# Patient Record
Sex: Female | Born: 1970 | Race: White | Hispanic: No | Marital: Single | Smoking: Never smoker
Health system: Southern US, Community
[De-identification: ages and names within clinical notes are randomized; demographics above are authoritative.]

## PROBLEM LIST (undated history)

## (undated) DIAGNOSIS — Z789 Other specified health status: Secondary | ICD-10-CM

## (undated) HISTORY — PX: CATARACT EXTRACTION: SUR2

---

## 2007-12-21 ENCOUNTER — Encounter (INDEPENDENT_AMBULATORY_CARE_PROVIDER_SITE_OTHER): Payer: Self-pay | Admitting: *Deleted

## 2007-12-27 ENCOUNTER — Encounter: Payer: Self-pay | Admitting: Family Medicine

## 2007-12-27 ENCOUNTER — Ambulatory Visit: Payer: Self-pay | Admitting: Family Medicine

## 2007-12-27 ENCOUNTER — Other Ambulatory Visit: Admission: RE | Admit: 2007-12-27 | Discharge: 2007-12-27 | Payer: Self-pay | Admitting: Family Medicine

## 2008-01-03 ENCOUNTER — Encounter (INDEPENDENT_AMBULATORY_CARE_PROVIDER_SITE_OTHER): Payer: Self-pay | Admitting: *Deleted

## 2010-11-24 ENCOUNTER — Other Ambulatory Visit: Payer: Self-pay | Admitting: Obstetrics and Gynecology

## 2017-10-07 ENCOUNTER — Other Ambulatory Visit: Payer: Self-pay | Admitting: Family Medicine

## 2017-10-07 DIAGNOSIS — R140 Abdominal distension (gaseous): Secondary | ICD-10-CM

## 2017-10-26 ENCOUNTER — Ambulatory Visit
Admission: RE | Admit: 2017-10-26 | Discharge: 2017-10-26 | Disposition: A | Discharge status: Home / Self Care | Payer: 59 | Source: Ambulatory Visit | Attending: Family Medicine | Admitting: Family Medicine

## 2017-10-26 DIAGNOSIS — R140 Abdominal distension (gaseous): Secondary | ICD-10-CM

## 2018-02-26 ENCOUNTER — Other Ambulatory Visit: Payer: Self-pay

## 2018-02-26 ENCOUNTER — Emergency Department (HOSPITAL_COMMUNITY)
Admission: EM | Admit: 2018-02-26 | Discharge: 2018-02-26 | Disposition: A | Discharge status: Home / Self Care | Payer: 59 | Attending: Emergency Medicine | Admitting: Emergency Medicine

## 2018-02-26 ENCOUNTER — Emergency Department (HOSPITAL_COMMUNITY): Payer: 59

## 2018-02-26 ENCOUNTER — Encounter (HOSPITAL_COMMUNITY): Payer: Self-pay | Admitting: Emergency Medicine

## 2018-02-26 DIAGNOSIS — Y999 Unspecified external cause status: Secondary | ICD-10-CM | POA: Diagnosis not present

## 2018-02-26 DIAGNOSIS — R52 Pain, unspecified: Secondary | ICD-10-CM

## 2018-02-26 DIAGNOSIS — Y92009 Unspecified place in unspecified non-institutional (private) residence as the place of occurrence of the external cause: Secondary | ICD-10-CM | POA: Diagnosis not present

## 2018-02-26 DIAGNOSIS — W010XXA Fall on same level from slipping, tripping and stumbling without subsequent striking against object, initial encounter: Secondary | ICD-10-CM | POA: Insufficient documentation

## 2018-02-26 DIAGNOSIS — S52571A Other intraarticular fracture of lower end of right radius, initial encounter for closed fracture: Secondary | ICD-10-CM | POA: Diagnosis not present

## 2018-02-26 DIAGNOSIS — Y9389 Activity, other specified: Secondary | ICD-10-CM | POA: Insufficient documentation

## 2018-02-26 DIAGNOSIS — S6991XA Unspecified injury of right wrist, hand and finger(s), initial encounter: Secondary | ICD-10-CM | POA: Diagnosis present

## 2018-02-26 DIAGNOSIS — S52611A Displaced fracture of right ulna styloid process, initial encounter for closed fracture: Secondary | ICD-10-CM

## 2018-02-26 IMAGING — DX DG WRIST 2V*R*
2 series · 2 of 2 positions shown · non-contrast
Comparison: Earlier same day

CLINICAL DATA: Post reduction distal radius fracture.

EXAM:
RIGHT WRIST - 2 VIEW

[wrist ap]
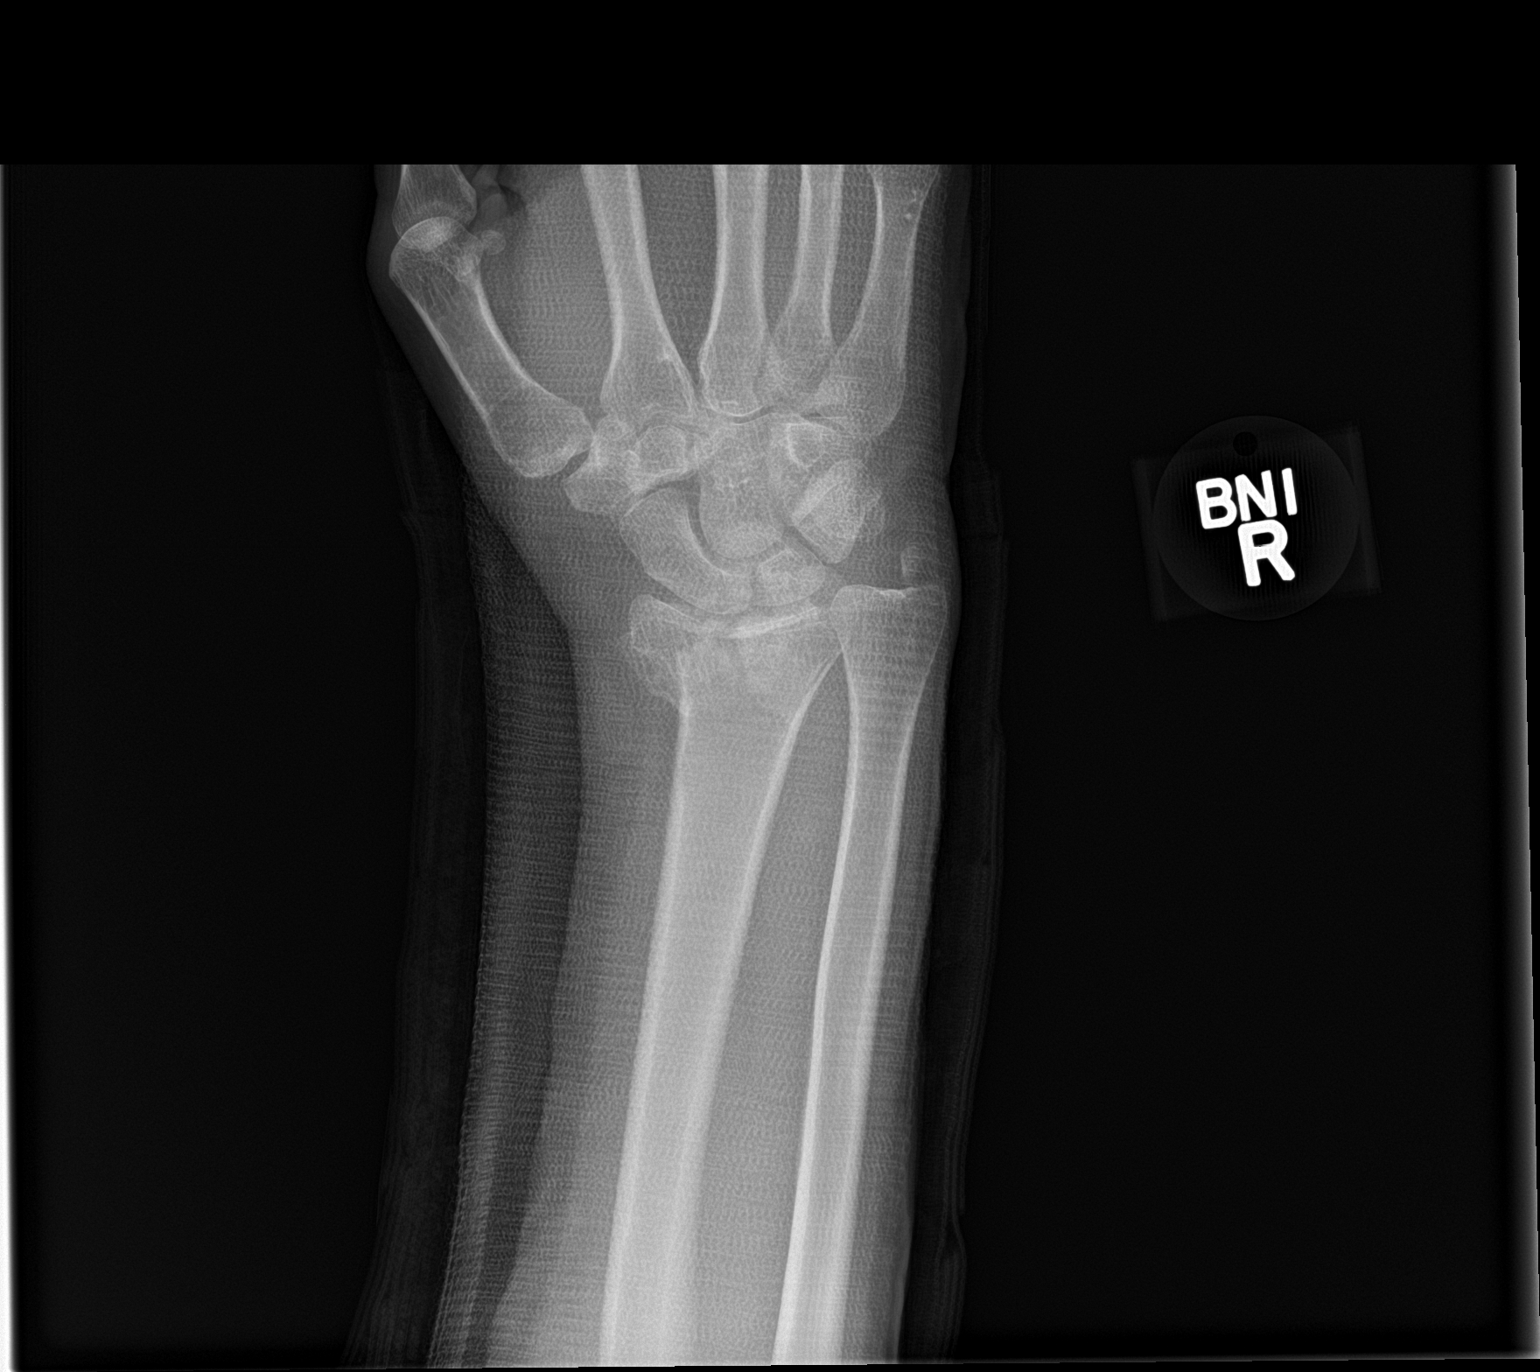

[wrist lat]
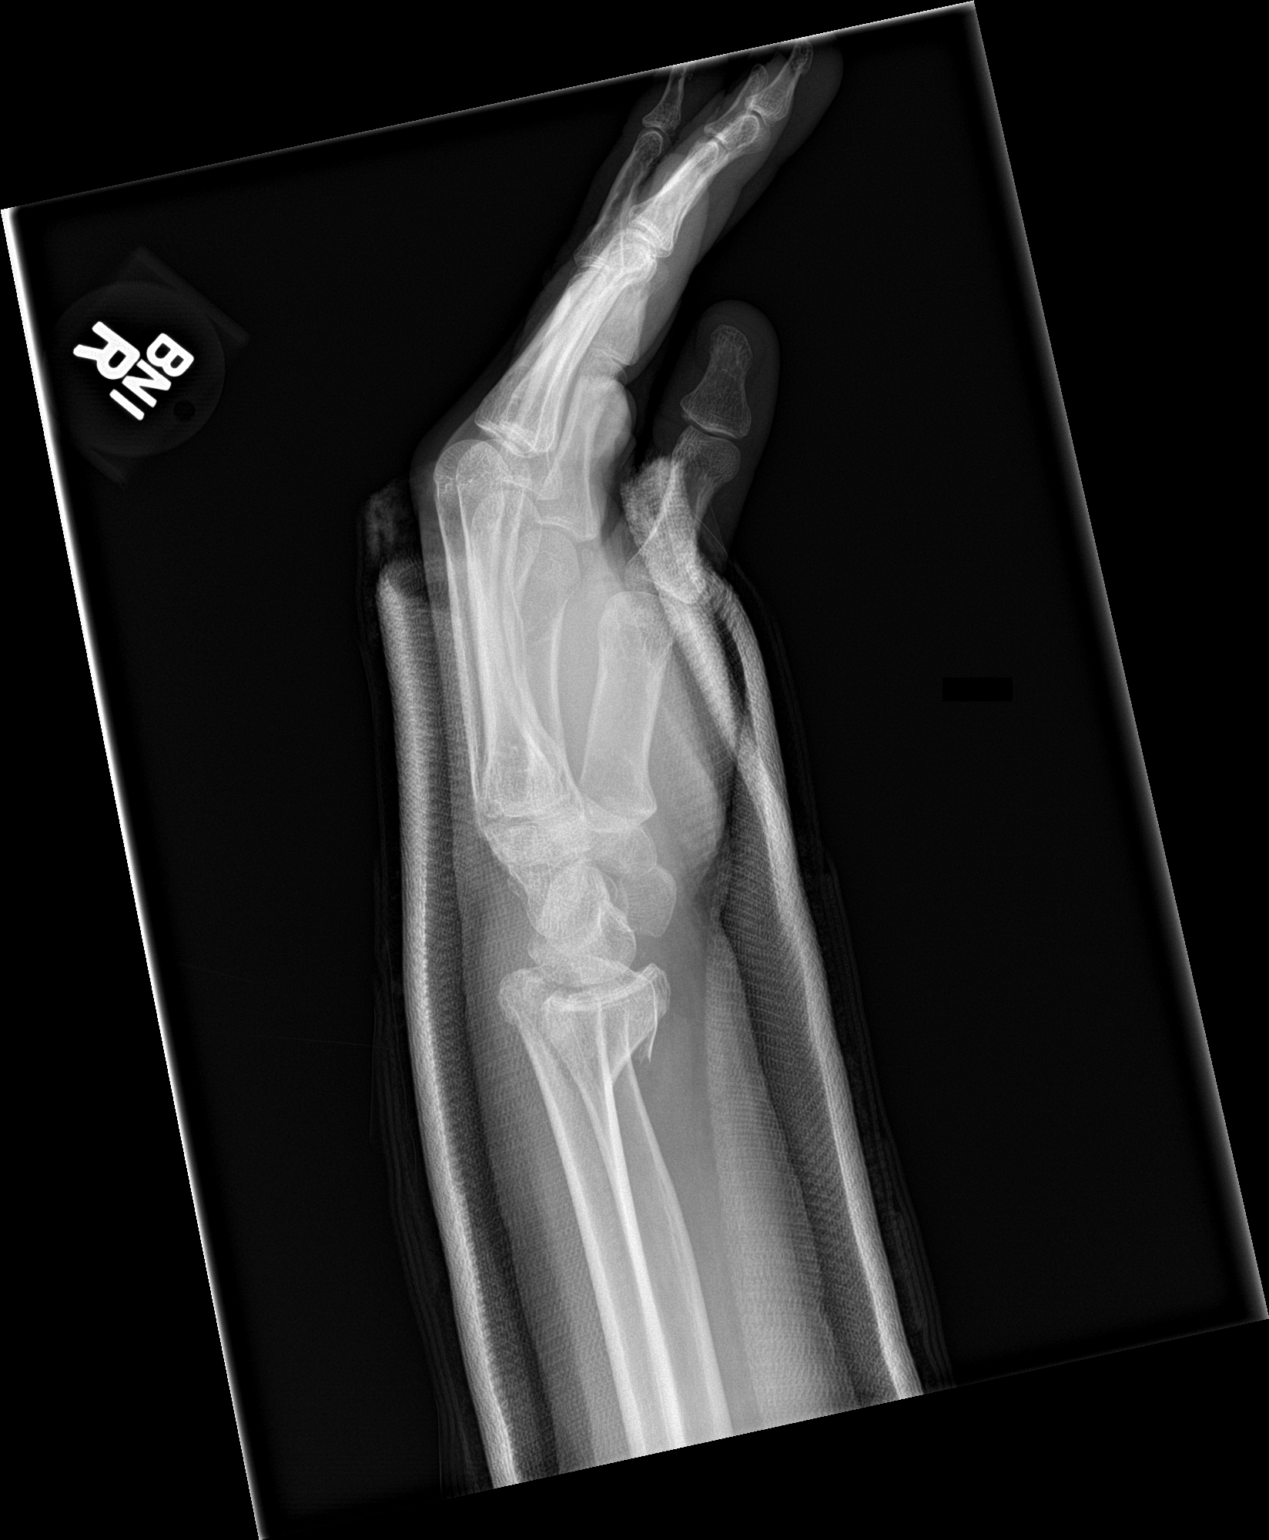

[2 of 2 positions shown; findings below may reference images not displayed]

FINDINGS: Two-view exam shows fine bony detail obscured by the overlying
fiberglass splint. Comminuted distal radius fracture again
identified with probable intra-articular extension. No substantial
change in bony alignment or position of the fracture fragments.
Associated ulnar styloid fracture evident.
IMPRESSION: Status post closed reduction with no substantial change in alignment
or position of fracture fragments.

## 2018-02-26 MED ORDER — ONDANSETRON HCL 4 MG/2ML IJ SOLN
4.0000 mg | Freq: Once | INTRAMUSCULAR | Status: AC
  Administered 2018-02-26: 4 mg via INTRAVENOUS
  Filled 2018-02-26: qty 2

## 2018-02-26 MED ORDER — HYDROMORPHONE HCL 1 MG/ML IJ SOLN
1.0000 mg | Freq: Once | INTRAMUSCULAR | Status: AC
  Administered 2018-02-26: 1 mg via INTRAVENOUS
  Filled 2018-02-26: qty 1

## 2018-02-26 MED ORDER — LIDOCAINE HCL (PF) 1 % IJ SOLN
30.0000 mL | Freq: Once | INTRAMUSCULAR | Status: AC
  Administered 2018-02-26: 30 mL
  Filled 2018-02-26: qty 30

## 2018-02-26 MED ORDER — FENTANYL CITRATE (PF) 100 MCG/2ML IJ SOLN
50.0000 ug | Freq: Once | INTRAMUSCULAR | Status: AC
  Administered 2018-02-26: 50 ug via INTRAVENOUS
  Filled 2018-02-26: qty 2

## 2018-02-26 MED ORDER — HYDROCODONE-ACETAMINOPHEN 5-325 MG PO TABS: 1.0000 | ORAL_TABLET | ORAL | 0 refills | Status: DC | PRN

## 2018-02-26 NOTE — Progress Notes (Signed)
Orthopedic Tech Progress Note Patient Details:  Isabella Morales Jun 15, 1970 [id number]  Ortho Devices Type of Ortho Device: Ace wrap, Sugartong splint, Arm sling Ortho Device/Splint Interventions: Application   Post Interventions Patient Tolerated: Well Instructions Provided: Care of device   Saul Fordyce 02/26/2018, 4:27 PM

## 2018-02-26 NOTE — ED Provider Notes (Signed)
.  Nerve Block Date/Time: 02/26/2018 4:10 PM Performed by: Sabas Sous, MD Authorized by: Sabas Sous, MD   Consent:    Consent obtained:  Verbal   Consent given by:  Patient   Risks discussed:  Unsuccessful block Indications:    Indications:  Procedural anesthesia Location:    Body area:  Upper extremity   Upper extremity nerve blocked: wrist.   Laterality:  Right Pre-procedure details:    Skin preparation:  2% chlorhexidine   Preparation: Patient was prepped and draped in usual sterile fashion   Procedure details (see MAR for exact dosages):    Block needle gauge:  27 G   Anesthetic injected:  Lidocaine 1% w/o epi   Injection procedure:  Anatomic landmarks identified and anatomic landmarks palpated   Paresthesia:  None Post-procedure details:    Dressing: bandaid.   Outcome:  Anesthesia achieved   Patient tolerance of procedure:  Tolerated well, no immediate complications Comments:     Comminuted distal radius fracture with displacement; uncomplicated and successful hematoma block.  Total of 10 cc of lidocaine. Reduction of fracture Date/Time: 02/26/2018 4:12 PM Performed by: Sabas Sous, MD Authorized by: Sabas Sous, MD  Consent: Verbal consent obtained. Risks and benefits: risks, benefits and alternatives were discussed Consent given by: patient Patient understanding: patient states understanding of the procedure being performed Patient identity confirmed: verbally with patient Local anesthesia used: yes Anesthesia: hematoma block  Anesthesia: Local anesthesia used: yes Local Anesthetic: lidocaine 1% without epinephrine Anesthetic total: 10 mL  Sedation: Patient sedated: no  Patient tolerance: Patient tolerated the procedure well with no immediate complications Comments: Good anesthesia with hematoma block.  Reduction of comminuted and mildly displaced right distal radius fracture using traction and finger traps.       Sabas Sous,  MD 02/26/18 [phone]

## 2018-02-26 NOTE — ED Notes (Signed)
EDP at bedside  

## 2018-02-26 NOTE — ED Triage Notes (Signed)
Pt. Stated, I fell playing with my niece and fell and caught myself on my rt. Wrist.

## 2018-02-26 NOTE — ED Provider Notes (Signed)
[facility] EMERGENCY DEPARTMENT Provider Note   CSN: [id number] Arrival date & time: 02/26/18  1336     History   Chief Complaint Chief Complaint  Patient presents with  . Wrist Pain  . Fall    HPI Isabella Morales is a 48 y.o. female.  48 year old female presents with right wrist injury.  Patient states she was playing with her niece when she fell over towards the right side landing on her right wrist.  Pain in the right wrist with deformity. Patient is right-hand dominant no other injuries or concerns. Last ate a few chips about 2 hours PTA, advised NPO at this time.      History reviewed. No pertinent past medical history.  There are no active problems to display for this patient.   History reviewed. No pertinent surgical history.   OB History   No obstetric history on file.      Home Medications    Prior to Admission medications   Medication Sig Start Date End Date Taking? Authorizing Provider  HYDROcodone-acetaminophen (NORCO/VICODIN) 5-325 MG tablet Take 1 tablet by mouth every 4 (four) hours as needed. 02/26/18   Jeannie Fend, PA-C    Family History No family history on file.  Social History Social History   Tobacco Use  . Smoking status: Never Smoker  . Smokeless tobacco: Never Used  Substance Use Topics  . Alcohol use: Yes  . Drug use: Not Currently     Allergies   Patient has no allergy information on record.   Review of Systems Review of Systems  Constitutional: Negative for fever.  Musculoskeletal: Positive for arthralgias, joint swelling and myalgias.  Skin: Negative for rash and wound.  Allergic/Immunologic: Negative for immunocompromised state.  Neurological: Negative for weakness and numbness.  Psychiatric/Behavioral: Negative for confusion.  All other systems reviewed and are negative.    Physical Exam Updated Vital Signs BP (!) 107/55 (BP Location: Left Arm)   Pulse 77   Resp 16   Ht 5\' 8"  (1.727 m)    Wt 68 kg   LMP 02/12/2018   SpO2 100%   BMI 22.81 kg/m   Physical Exam Vitals signs and nursing note reviewed.  Constitutional:      General: She is not in acute distress.    Appearance: She is well-developed. She is not diaphoretic.  HENT:     Head: Normocephalic and atraumatic.  Cardiovascular:     Pulses: Normal pulses.  Pulmonary:     Effort: Pulmonary effort is normal.  Musculoskeletal:        General: Tenderness, deformity and signs of injury present.     Right shoulder: She exhibits no tenderness and no bony tenderness.     Right elbow: She exhibits no swelling, no effusion and no deformity. No tenderness found.     Right wrist: She exhibits bony tenderness and deformity. She exhibits no laceration.     Right hand: She exhibits no tenderness, no bony tenderness and normal capillary refill. Normal sensation noted.  Skin:    General: Skin is warm and dry.     Findings: No erythema or rash.  Neurological:     General: No focal deficit present.     Mental Status: She is alert and oriented to person, place, and time.  Psychiatric:        Behavior: Behavior normal.      ED Treatments / Results  Labs (all labs ordered are listed, but only abnormal results are displayed)  Labs Reviewed - No data to display  EKG None  Radiology Dg Wrist Complete Right  Result Date: 02/26/2018 CLINICAL DATA:  Fall in driveway. Right wrist pain and deformity. Initial encounter. EXAM: RIGHT WRIST - COMPLETE 3+ VIEW COMPARISON:  None. FINDINGS: Comminuted and mildly displaced fracture of distal radial metaphysis is seen with intra-articular extension into the radiocarpal joint. Fracture through the ulnar styloid process is also seen. No evidence of dislocation. IMPRESSION: Distal radial metaphysis and ulnar styloid fractures, as described above. Electronically Signed   By: Myles Rosenthal M.D.   On: 02/26/2018 15:08    Procedures Procedures (including critical care time)  Medications  Ordered in ED Medications  fentaNYL (SUBLIMAZE) injection 50 mcg (50 mcg Intravenous Given 02/26/18 1351)  ondansetron (ZOFRAN) injection 4 mg (4 mg Intravenous Given 02/26/18 1406)  lidocaine (PF) (XYLOCAINE) 1 % injection 30 mL (30 mLs Other Given 02/26/18 1456)  HYDROmorphone (DILAUDID) injection 1 mg (1 mg Intravenous Given 02/26/18 1456)     Initial Impression / Assessment and Plan / ED Course  I have reviewed the triage vital signs and the nursing notes.  Pertinent labs & imaging results that were available during my care of the patient were reviewed by me and considered in my medical decision making (see chart for details).  Clinical Course as of Feb 27 1603  Sun Feb 26, 2018  505 49 year old female with right wrist injury after fall on outstretched hand.  Injury occurred today, patient is right-hand dominant, no other injuries.  On exam patient has deformity of right wrist with radial deviation of her hand, neurovascular intact, no pain at the elbow.  X-rays obtained and case discussed with Dr. Wandra Feinstein on-call with hand Ortho who has reviewed images and recommends hematoma block with traction x5 minutes, placed in sugar tong splint, obtain postreduction x-ray.  Patient to follow-up in the office tomorrow morning at 830 with plan for surgery on Thursday.  Discussed plan of care with patient who verbalizes understanding. Dr. Pilar Plate, ER attending, to see patient and assist with procedure.    [LM]  1603 Patient tolerated traction/splinting very well, plan is to obtain post reduction film prior to dc. Patient is aware of follow up plan including apt with Dr. Eulah Pont at 8:30AM tomorrow with plan for surgery on Thursday.    [LM]    Clinical Course User Index [LM] Jeannie Fend, PA-C   Final Clinical Impressions(s) / ED Diagnoses   Final diagnoses:  Other closed intra-articular fracture of distal end of right radius, initial encounter  Closed displaced fracture of styloid process of right  ulna, initial encounter    ED Discharge Orders         Ordered    HYDROcodone-acetaminophen (NORCO/VICODIN) 5-325 MG tablet  Every 4 hours PRN     02/26/18 1538           Jeannie Fend, PA-C 02/26/18 1606    Sabas Sous, MD 02/27/18 1044

## 2018-02-26 NOTE — ED Notes (Signed)
Patient transported to X-ray 

## 2018-02-26 NOTE — Discharge Instructions (Signed)
Follow-up with orthopedics, you have an appointment tomorrow morning at 830 with Dr. Eulah Pont. Return to the ER for any new or worsening symptoms. At home, elevate and apply ice on top of your splint for 20 minutes at a time. Take Norco as needed as prescribed for pain.

## 2018-02-26 NOTE — ED Notes (Signed)
Pt stable, ambulatory, states understanding of discharge instructions 

## 2018-09-13 ENCOUNTER — Encounter: Payer: Self-pay | Admitting: Family Medicine

## 2018-09-13 ENCOUNTER — Ambulatory Visit: Payer: 59 | Admitting: Family Medicine

## 2018-09-13 ENCOUNTER — Other Ambulatory Visit: Payer: Self-pay

## 2018-09-13 ENCOUNTER — Ambulatory Visit
Admission: RE | Admit: 2018-09-13 | Discharge: 2018-09-13 | Disposition: A | Discharge status: Home / Self Care | Payer: 59 | Source: Ambulatory Visit | Attending: Family Medicine | Admitting: Family Medicine

## 2018-09-13 ENCOUNTER — Ambulatory Visit: Payer: Self-pay

## 2018-09-13 VITALS — BP 128/74

## 2018-09-13 DIAGNOSIS — M25511 Pain in right shoulder: Secondary | ICD-10-CM

## 2018-09-13 DIAGNOSIS — S4992XA Unspecified injury of left shoulder and upper arm, initial encounter: Secondary | ICD-10-CM

## 2018-09-13 NOTE — Patient Instructions (Signed)
Your shoulder pain may be caused by a subscapularis (rotator cuff tear) The ultrasound from today did show irregularities along humerus You will get an xray of your shoulder to make sure there are no fractures of your humerus You will be called with the xray results. If normal, we will get an MRI of your shoulder to look for a rotator cuff tear

## 2018-09-13 NOTE — Progress Notes (Signed)
PCP: Vernie Shanks, MD  Subjective:   HPI: Patient is a 48 y.o. female here for evaluation of right shoulder pain.  Approximately 6 weeks ago on Father's Day weekend patient fell and injured her shoulder.  She is unsure of the exact mechanism of how she landed on her shoulder.  Patient now notes lateral and anterior shoulder pain.  She has had some improvement of her shoulder pain since the injury but still notes ongoing pain with overhead activities and she cannot bring her arm behind her back.  Initially following the injury patient did have swelling and bruising on the inner aspect of her arm.  This is since resolved and she has not had any recurrent bruising.  She denies any numbness or tingling in her arm.  She has no weakness extending down to her hand.  She denies any neck pain.  Patient is just taking over-the-counter inflammatories without improvement of her pain.  She has not had evaluation for her shoulder yet.  Review of Systems: See HPI above.  History reviewed. No pertinent past medical history.  Current Outpatient Medications on File Prior to Visit  Medication Sig Dispense Refill  . HYDROcodone-acetaminophen (NORCO/VICODIN) 5-325 MG tablet Take 1 tablet by mouth every 4 (four) hours as needed. 10 tablet 0   No current facility-administered medications on file prior to visit.     History reviewed. No pertinent surgical history.  Not on File  Social History   Socioeconomic History  . Marital status: Single    Spouse name: Not on file  . Number of children: Not on file  . Years of education: Not on file  . Highest education level: Not on file  Occupational History  . Not on file  Social Needs  . Financial resource strain: Not on file  . Food insecurity    Worry: Not on file    Inability: Not on file  . Transportation needs    Medical: Not on file    Non-medical: Not on file  Tobacco Use  . Smoking status: Never Smoker  . Smokeless tobacco: Never Used   Substance and Sexual Activity  . Alcohol use: Yes  . Drug use: Not Currently  . Sexual activity: Not on file  Lifestyle  . Physical activity    Days per week: Not on file    Minutes per session: Not on file  . Stress: Not on file  Relationships  . Social Herbalist on phone: Not on file    Gets together: Not on file    Attends religious service: Not on file    Active member of club or organization: Not on file    Attends meetings of clubs or organizations: Not on file    Relationship status: Not on file  . Intimate partner violence    Fear of current or ex partner: Not on file    Emotionally abused: Not on file    Physically abused: Not on file    Forced sexual activity: Not on file  Other Topics Concern  . Not on file  Social History Narrative  . Not on file    History reviewed. No pertinent family history.      Objective:  Physical Exam: BP 128/74  Gen: NAD, comfortable in exam room Lungs: Breathing comfortably on room air  Shoulder Exam Right -Inspection: No discoloration, no deformity -Palpation: Tenderness to palpation along the long head of the biceps tendon as well as the lateral aspect of the  shoulder at the supraspinatus insertion -ROM (active): Abduction: 150 degrees; Forward Flexion: 160 degrees; Internal Rotation: Unable to reach behind back -ROM (Passive): Abduction: 170 degrees; Forward Flexion: 180 degrees -Strength: Abduction: 4/5; Forward Flexion: 5/5; Internal Rotation: 4/5; External Rotation: 5/5 -Special Tests: Hawkins: Negative; Neers: Negative; Jobs: Negative; O'briens: Negative; Speeds: positive; belly press: Positive -Limb neurovascularly intact  Contralateral Shoulder -Inspection: No discoloration, no deformity -Palpation: No tenderness to palpation -ROM (active): Abduction: 180 degrees; Forward Flexion: 180 degrees; Internal Rotation: T10 -ROM (Passive): Abduction: 180 degrees; Forward Flexion: 180 degrees; Internal Rotation:  T10 -Strength: Abduction: 5/5; Forward Flexion: 5/5; Internal Rotation: 5/5; External Rotation: 5/5 -Limb neurovascularly intact  Cervical Exam:  -Full range of motion with flexion, extension, lateral rotation.    Ultrasound of right shoulder Findings: - Biceps tendon: Partial tearing was noted at the proximal insertion of the long head of the biceps.  This is seen in both long and short axis.  Further distal there is tenosynovitis seen along the long head of the biceps tendon - Subscapularis: There is cortical irregularity along the humeral head at the attachment point of the subscapularis.  The subscapularis is difficult to visualize with disruption of normal tendon fibers.  There are hypoechoic changes in this area. - Supraspinatus: Supraspinatus was intact with no evidence of tearing. - Infraspinatus: Infraspinatus had normal appearance - Teres minor: Teres minor had normal appearance -AC joint: AC joint with normal appearance - Glenohumeral joint: Glenohumeral joint with normal appearance Impression: - Ultrasound findings concerning for possible subscapularis tear as well as tear of the long head of the biceps tendon.  Cortical irregularity seen could be suggestive of a humeral fracture vs association with subscapularis tear   Assessment & Plan:  Patient is a 48 y.o. female here for evaluation of right shoulder pain.  1. Right shoulder injury - Exam and ultrasound findings concerning for possible subscapularis tear as well as cortical irregularity which may be consistent with a previous humeral fracture -We will obtain x-rays of the right shoulder to rule out fracture of the humerus -If x-rays of the right shoulder are normal and MRI will be obtained to further evaluate for subscapularis tear -We will call patient with results of the x-rays to discuss further work-up - Tylenol, ibuprofen if needed in meantime.

## 2018-09-14 NOTE — Addendum Note (Signed)
Addended by: Cyd Silence on: 09/14/2018 04:22 PM   Modules accepted: Orders

## 2018-10-10 ENCOUNTER — Other Ambulatory Visit: Payer: Self-pay

## 2018-10-10 ENCOUNTER — Ambulatory Visit
Admission: RE | Admit: 2018-10-10 | Discharge: 2018-10-10 | Disposition: A | Discharge status: Home / Self Care | Payer: 59 | Source: Ambulatory Visit | Attending: Family Medicine | Admitting: Family Medicine

## 2018-10-10 DIAGNOSIS — M25511 Pain in right shoulder: Secondary | ICD-10-CM

## 2018-10-11 ENCOUNTER — Encounter: Payer: Self-pay | Admitting: Family Medicine

## 2018-10-11 ENCOUNTER — Ambulatory Visit: Payer: Self-pay

## 2018-10-11 ENCOUNTER — Ambulatory Visit (INDEPENDENT_AMBULATORY_CARE_PROVIDER_SITE_OTHER): Payer: 59 | Admitting: Family Medicine

## 2018-10-11 VITALS — BP 124/64 | Wt 145.0 lb

## 2018-10-11 DIAGNOSIS — M25511 Pain in right shoulder: Secondary | ICD-10-CM

## 2018-10-11 DIAGNOSIS — S42262D Displaced fracture of lesser tuberosity of left humerus, subsequent encounter for fracture with routine healing: Secondary | ICD-10-CM

## 2018-10-11 NOTE — Patient Instructions (Signed)
[facility] [place] Fri  8.28.20 @ [phone]

## 2018-10-11 NOTE — Progress Notes (Signed)
PCP: Ileana LaddWong, Francis P, MD  Subjective:   HPI: Patient is a 48 y.o. female here for MRI follow-up.  Patient was last seen several weeks ago due to pain and limited range of motion in her right shoulder.  Patient's MRI shows a large avulsion fracture of the lesser tuberosity involving the majority of the subscapularis tendon attachment.  The subscapularis tendon itself was intact.  Patient also had a small posteroinferior labral tear and mild biceps tenosynovitis.  Patient notes since her last visit she has had some improvement of her pain however she is still has significant limitations in her range of motion and strength of her shoulder.  Patient has difficulty with overhead activities as well as reaching behind her back.  The pain in her shoulder does not radiate.  It has an aching quality.  She denies any neck pain.  She has no associated numbness or tingling.  She notes the pain does improve with over-the-counter anti-inflammatories.   Review of Systems: See HPI above.  History reviewed. No pertinent past medical history.  Current Outpatient Medications on File Prior to Visit  Medication Sig Dispense Refill  . HYDROcodone-acetaminophen (NORCO/VICODIN) 5-325 MG tablet Take 1 tablet by mouth every 4 (four) hours as needed. 10 tablet 0   No current facility-administered medications on file prior to visit.     History reviewed. No pertinent surgical history.  Not on File  Social History   Socioeconomic History  . Marital status: Single    Spouse name: Not on file  . Number of children: Not on file  . Years of education: Not on file  . Highest education level: Not on file  Occupational History  . Not on file  Social Needs  . Financial resource strain: Not on file  . Food insecurity    Worry: Not on file    Inability: Not on file  . Transportation needs    Medical: Not on file    Non-medical: Not on file  Tobacco Use  . Smoking status: Never Smoker  . Smokeless tobacco: Never  Used  Substance and Sexual Activity  . Alcohol use: Yes  . Drug use: Not Currently  . Sexual activity: Not on file  Lifestyle  . Physical activity    Days per week: Not on file    Minutes per session: Not on file  . Stress: Not on file  Relationships  . Social Musicianconnections    Talks on phone: Not on file    Gets together: Not on file    Attends religious service: Not on file    Active member of club or organization: Not on file    Attends meetings of clubs or organizations: Not on file    Relationship status: Not on file  . Intimate partner violence    Fear of current or ex partner: Not on file    Emotionally abused: Not on file    Physically abused: Not on file    Forced sexual activity: Not on file  Other Topics Concern  . Not on file  Social History Narrative  . Not on file    History reviewed. No pertinent family history.    Objective:  Physical Exam: BP 124/64   Wt 145 lb (65.8 kg)   BMI 22.05 kg/m  Gen: NAD, comfortable in exam room Lungs: Breathing comfortably on room air Shoulder Exam right -Inspection: No discoloration, no deformity -Palpation: No tenderness to palpation -ROM (active): Abduction: 120 degrees; Forward Flexion: 120 degrees; Internal Rotation:  L5 -ROM (Passive): Abduction: 170 degrees; Forward Flexion: 170 degrees -Strength: Abduction: 4/5; Forward Flexion: 5/5; Internal Rotation: 4/5; External Rotation: 5/5 -No instability of the shoulder noted -Limb neurovascularly intact  Contralateral Shoulder -Inspection: No discoloration, no deformity -Palpation: No tenderness to palpation -ROM (active): Abduction: 180 degrees; Forward Flexion: 180 degrees; Internal Rotation: T10  Limited diagnostic ultrasound of the right shoulder Findings: - Lesser tuberosity avulsed from the humerus and displaced approximately 9 mm - Subscapularis tendon appears intact and attached to the fragmented piece of the lesser tuberosity Impression: -Avulsion fracture  of the lesser tuberosity visualized under ultrasound with approximately 9 mm retraction.   MRI of Right Shoulder IMPRESSION: 1. Large avulsion fracture of the lesser tuberosity involving the majority of the subscapularis tendon attachment. The tendon itself appears intact. 2. Small posteroinferior labral tear. 3. Mild biceps tenosynovitis.   Assessment & Plan:  Patient is a 48 y.o. female here for MRI follow-up right shoulder pain  1.  Avulsion fracture of the lesser tuberosity of her right shoulder - Patient with significant strength and range of motion deficits however her pain has improved - Given the 9 mm displacement and range of motion deficits she will be referred to Dr. Percell Miller who is her orthopedic surgeon for evaluation for surgical repair

## 2020-11-04 ENCOUNTER — Other Ambulatory Visit: Payer: Self-pay | Admitting: Family Medicine

## 2020-11-04 DIAGNOSIS — S4291XS Fracture of right shoulder girdle, part unspecified, sequela: Secondary | ICD-10-CM

## 2020-11-04 DIAGNOSIS — Z13820 Encounter for screening for osteoporosis: Secondary | ICD-10-CM

## 2021-04-22 ENCOUNTER — Ambulatory Visit
Admission: RE | Admit: 2021-04-22 | Discharge: 2021-04-22 | Disposition: A | Discharge status: Home / Self Care | Payer: 59 | Source: Ambulatory Visit | Attending: Family Medicine | Admitting: Family Medicine

## 2021-04-22 DIAGNOSIS — Z13820 Encounter for screening for osteoporosis: Secondary | ICD-10-CM

## 2021-04-22 DIAGNOSIS — S4291XS Fracture of right shoulder girdle, part unspecified, sequela: Secondary | ICD-10-CM

## 2021-08-03 ENCOUNTER — Ambulatory Visit (INDEPENDENT_AMBULATORY_CARE_PROVIDER_SITE_OTHER): Payer: 59 | Admitting: Ophthalmology

## 2021-08-03 ENCOUNTER — Encounter (INDEPENDENT_AMBULATORY_CARE_PROVIDER_SITE_OTHER): Payer: Self-pay | Admitting: Ophthalmology

## 2021-08-03 DIAGNOSIS — H33323 Round hole, bilateral: Secondary | ICD-10-CM

## 2021-08-03 DIAGNOSIS — H3321 Serous retinal detachment, right eye: Secondary | ICD-10-CM | POA: Diagnosis not present

## 2021-08-03 DIAGNOSIS — H35412 Lattice degeneration of retina, left eye: Secondary | ICD-10-CM | POA: Diagnosis not present

## 2021-08-03 DIAGNOSIS — H25813 Combined forms of age-related cataract, bilateral: Secondary | ICD-10-CM | POA: Diagnosis not present

## 2021-08-03 MED ORDER — PREDNISOLONE ACETATE 1 % OP SUSP: 1.0000 [drp] | Freq: Four times a day (QID) | OPHTHALMIC | 0 refills | Status: AC

## 2021-08-03 NOTE — Progress Notes (Signed)
[facility]  08/03/2021     CHIEF COMPLAINT Patient presents for Retina Evaluation   HISTORY OF PRESENT ILLNESS: Isabella Morales is a 51 y.o. female who presents to the clinic today for:   HPI     Retina Evaluation   In right eye.  This started 1 week ago.  Associated Symptoms Floaters and Distortion.  Negative for Flashes.  Context:  distance vision, mid-range vision, near vision and reading.  I, the attending physician,  performed the HPI with the patient and updated documentation appropriately.        Comments   Patient states that she was in [place] and started noticing vision changes. She went to an ophthalmologist and was told that she has a mac off RD in the right eye. The vail and shadow has already comes the vision about 5 days ago.       Last edited by Bernarda Caffey, MD on 08/03/2021 11:41 AM.    Pt was in [place] last week and noticed on Sunday / Monday her vision started getting blurry, she saw on optometrist Cyril Mourning Reidy) out there who told her she had RD in her right eye, pt states she noticed floaters in that eye for about 2 weeks prior to leaving for [place], pt thinks her central vision was lost last Wednesday (6.14.23), maybe sooner.  Referring physician: Vernie Shanks, MD [place],  [place] [address]  HISTORICAL INFORMATION:   Selected notes from the MEDICAL RECORD NUMBER Referred by [facility] for mac off RD LEE:  Ocular Hx- PMH-    CURRENT MEDICATIONS: Current Outpatient Medications (Ophthalmic Drugs)  Medication Sig   prednisoLONE acetate (PRED FORTE) 1 % ophthalmic suspension Place 1 drop into the left eye 4 (four) times daily for 7 days.   No current facility-administered medications for this visit. (Ophthalmic Drugs)   Current Outpatient Medications (Other)  Medication Sig   HYDROcodone-acetaminophen (NORCO/VICODIN) 5-325 MG tablet Take 1 tablet by mouth every 4 (four) hours as needed.  (Patient not taking: Reported on 08/03/2021)   No current facility-administered medications for this visit. (Other)   REVIEW OF SYSTEMS: ROS   Positive for: Eyes Last edited by Annie Paras, COT on 08/03/2021  8:09 AM.     ALLERGIES No Known Allergies  PAST MEDICAL HISTORY History reviewed. No pertinent past medical history. History reviewed. No pertinent surgical history.  FAMILY HISTORY Family History  Problem Relation Age of Onset   Diabetes Mother    Heart disease Father    SOCIAL HISTORY Social History   Tobacco Use   Smoking status: Never   Smokeless tobacco: Never  Substance Use Topics   Alcohol use: Yes   Drug use: Not Currently       OPHTHALMIC EXAM:  Base Eye Exam     Visual Acuity (Snellen - Linear)       Right Left   Dist [place] CF at 3' 20/30   Dist ph [place]  20/25         Tonometry (Tonopen, 8:17 AM)       Right Left   Pressure 15 16         Pupils       Pupils Dark Light Shape React APD   Right PERRL 3 2 Round Minimal None   Left PERRL 3 2 Round Minimal None         Visual Fields  Left Right    Full    Restrictions  Partial outer superior temporal, inferior temporal deficiencies         Extraocular Movement       Right Left    Full, Ortho Full, Ortho         Neuro/Psych     Oriented x3: Yes         Dilation     Both eyes: 1.0% Mydriacyl, 2.5% Phenylephrine @ 8:14 AM           Slit Lamp and Fundus Exam     Slit Lamp Exam       Right Left   Lids/Lashes Normal Dermatochalasis - upper lid   Conjunctiva/Sclera White and quiet White and quiet   Cornea trace PEE trace PEE   Anterior Chamber deep, clear, narrow temporal angle deep, clear   Iris Round and dilated Round and dilated   Lens 2+ Nuclear sclerosis, 2+ Cortical cataract 2+ Nuclear sclerosis, 2+ Cortical cataract   Anterior Vitreous Vitreous syneresis, +pigment Vitreous syneresis         Fundus Exam       Right Left   Disc Pink and  Sharp Pink and Sharp   C/D Ratio 0.1 0.1   Macula Bullous RD with +corrugations Flat, Good foveal reflex, No heme or edema   Vessels Tortuous, mild AV crossing changes attenuated, Tortuous   Periphery bullous RD from 2263-3354 with retinal tears at 1030, 1100, 1200 and 0700 Attached, pigment lattice from 0500-0600 with retinal holes at 0500 and 0600 with shallow SRF, focal lattice at 0730, pigmented lattice at 0100 and 0130 - no SRF           Refraction     Manifest Refraction       Sphere Cylinder Axis Dist VA   Right       Left -0.50 +0.50 031 20/20           IMAGING AND PROCEDURES  Imaging and Procedures for 08/03/2021  OCT, Retina - OU - Both Eyes       Right Eye Quality was good. Central Foveal Thickness: 374. Progression has no prior data. Findings include abnormal foveal contour, intraretinal fluid, subretinal fluid (Bullous mac off detachment).   Left Eye Quality was good. Central Foveal Thickness: 297. Progression has no prior data. Findings include normal foveal contour, no IRF, no SRF, vitreomacular adhesion .   Notes *Images captured and stored on drive  Diagnosis / Impression:  OD: Bullous temporal mac off detachment OS: NFP, no IRF/SRF  Clinical management:  See below  Abbreviations: NFP - Normal foveal profile. CME - cystoid macular edema. PED - pigment epithelial detachment. IRF - intraretinal fluid. SRF - subretinal fluid. EZ - ellipsoid zone. ERM - epiretinal membrane. ORA - outer retinal atrophy. ORT - outer retinal tubulation. SRHM - subretinal hyper-reflective material. IRHM - intraretinal hyper-reflective material      Color Fundus Photography Optos - OU - Both Eyes       Right Eye Progression has no prior data. Disc findings include normal observations. Macula : detached. Vessels : normal observations. Periphery : detachment (3 obvious tears in ST quad).   Left Eye Progression has no prior data. Disc findings include normal observations.  Macula : normal observations. Vessels : normal observations. Periphery : normal observations.   Notes **Images stored on drive**  Impression: OD: bullous mac off / subtotal temporal detachment from 0600-1230 -- 3 tears in ST periphery OS: normal study  Repair Retinal Breaks, Laser - OS - Left Eye       LASER PROCEDURE NOTE  Procedure:  Barrier laser retinopexy using slit lamp laser, LEFT eye   Diagnosis:   Lattice degeneration w/ atrophic holes, LEFT eye                     Patches of lattice: 0100-0130 superiorly; 0500-0730 inferiorly  Surgeon: Bernarda Caffey, MD, PhD  Anesthesia: Topical  Informed consent obtained, operative eye marked, and time out performed prior to initiation of laser.   Laser settings:  Lumenis Smart532 laser, slit lamp Lens: Mainster PRP 165 Power: 260 mW Spot size: 200 microns Duration: 30 msec  # spots: 676  Placement of laser: Using a Mainster PRP 165 contact lens at the slit lamp, laser was placed in three confluent rows around patches of lattice from 0100-0130 superiorly and 0500-0730 inferiorly, anterior to equator.  Complications: None.  Patient tolerated the procedure well and received written and verbal post-procedure care information/education.            ASSESSMENT/PLAN:    ICD-10-CM   1. 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 Retinal hole of both eyes  H33.323 Color Fundus Photography Optos - OU - Both Eyes    Repair Retinal Breaks, Laser - OS - Left Eye    4. 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   - Potential treatment options including delimiting laser, pneumatic retinopexy, scleral buckle, and vitrectomy, cryotherapy and laser, and the use of air, gas, and oil discussed with patient. - The risks of blindness, loss of vision, infection, hemorrhage, cataract progression or lens displacement were discussed with patient. - recommend SBP + 25g PPV/EL/Gas OD under general anesthesia - pt wishes to proceed with surgery - RBA of procedure discussed, questions answered - informed consent obtained and signed - case scheduled for August 13, 2021, [facility] OR 8, 11:30am - f/u POD1, June 30th  2,3. Lattice degeneration w/ atrophic holes, left eye - pigment lattice from 0500-0600 with retinal holes at 0500 and 0600 with shallow SRF; focal lattice at 0730, pigmented lattice from 0100 and 0130 - no SRF - discussed findings, prognosis, and treatment options including observation - recommend laser retinopexy OS today, 06.19.23 - pt wishes to proceed with laser - RBA of procedure discussed, questions answered - informed consent obtained and signed - see procedure note - start PF QID OS x7 days - will dilate OS in OR next week and touch up laser if needed - June 30 -- POV  4. Mixed Cataract OU - The symptoms of cataract, surgical options, and treatments and risks were discussed with patient. - discussed diagnosis and progression -- specifically post vitrectomy progression - monitor  Ophthalmic Meds Ordered this visit:  Meds ordered this encounter  Medications   prednisoLONE acetate (PRED FORTE) 1 %  ophthalmic suspension    Sig: Place 1 drop into the left eye 4 (four) times daily for 7 days.    Dispense:  10 mL    Refill:  0     Return for f/u June 30 for POD 1, DFE.  There are no Patient Instructions on  file for this visit.   Explained the diagnoses, plan, and follow up with the patient and they expressed understanding.  Patient expressed understanding of the importance of proper follow up care.   This document serves as a record of services personally performed by Gardiner Sleeper, MD, PhD. It was created on their behalf by San Jetty. Owens Shark, OA an ophthalmic technician. The creation of this record is the provider's dictation and/or activities during the visit.    Electronically signed by: San Jetty. Marguerita Merles 06.19.2023 11:49 AM  Gardiner Sleeper, M.D., Ph.D. Diseases & Surgery of the Retina and Vitreous [facility]  I have reviewed the above documentation for accuracy and completeness, and I agree with the above. Gardiner Sleeper, M.D., Ph.D. 08/03/21 11:49 AM  Abbreviations: M myopia (nearsighted); A astigmatism; H hyperopia (farsighted); P presbyopia; Mrx spectacle prescription;  CTL contact lenses; OD right eye; OS left eye; OU both eyes  XT exotropia; ET esotropia; PEK punctate epithelial keratitis; PEE punctate epithelial erosions; DES dry eye syndrome; MGD meibomian gland dysfunction; ATs artificial tears; PFAT's preservative free artificial tears; [place] nuclear sclerotic cataract; PSC posterior subcapsular cataract; ERM epi-retinal membrane; PVD posterior vitreous detachment; RD retinal detachment; DM diabetes mellitus; DR diabetic retinopathy; NPDR non-proliferative diabetic retinopathy; PDR proliferative diabetic retinopathy; CSME clinically significant macular edema; DME diabetic macular edema; dbh dot blot hemorrhages; CWS cotton wool spot; POAG primary open angle glaucoma; C/D cup-to-disc ratio; HVF humphrey visual field; GVF goldmann visual field; OCT optical coherence tomography; IOP intraocular pressure; BRVO Branch retinal vein occlusion; CRVO central retinal vein occlusion; CRAO central retinal artery occlusion; BRAO branch retinal artery occlusion; RT retinal  tear; SB scleral buckle; PPV pars plana vitrectomy; VH Vitreous hemorrhage; PRP panretinal laser photocoagulation; IVK intravitreal kenalog; VMT vitreomacular traction; MH Macular hole;  NVD neovascularization of the disc; NVE neovascularization elsewhere; AREDS age related eye disease study; ARMD age related macular degeneration; POAG primary open angle glaucoma; EBMD epithelial/anterior basement membrane dystrophy; ACIOL anterior chamber intraocular lens; IOL intraocular lens; PCIOL posterior chamber intraocular lens; Phaco/IOL phacoemulsification with intraocular lens placement; [place] photorefractive keratectomy; LASIK laser assisted in situ keratomileusis; HTN hypertension; DM diabetes mellitus; COPD chronic obstructive pulmonary disease

## 2021-08-04 ENCOUNTER — Encounter (INDEPENDENT_AMBULATORY_CARE_PROVIDER_SITE_OTHER): Payer: 59 | Admitting: Ophthalmology

## 2021-08-06 NOTE — H&P (Signed)
Isabella Morales is an 51 y.o. female.    Chief Complaint: retinal detachment, RIGHT EYE  HPI: Pt with 1-2 wk history of decreased vision and visual field loss OD. On dilated exam, found to have a macula involving retinal detachment of the right eye. After a discussion of the risks benefits and alternatives to surgery, the patient has elected to proceed with surgical repair of the retinal detachment -- SBP + 25g PPV w/ endolaser and gas OD, under general anesthesia.  No past medical history on file.  No past surgical history on file.  Family History  Problem Relation Age of Onset   Diabetes Mother    Heart disease Father    Social History:  reports that she has never smoked. She has never used smokeless tobacco. She reports current alcohol use. She reports that she does not currently use drugs.  Allergies: No Known Allergies  No medications prior to admission.    Review of systems otherwise negative  There were no vitals taken for this visit.  Physical exam: Mental status: oriented x3. Eyes: See eye exam associated with this date of surgery Ears, Nose, Throat: within normal limits Neck: Within Normal limits General: within normal limits Chest: Within normal limits Breast: deferred Heart: Within normal limits Abdomen: Within normal limits GU: deferred Extremities: within normal limits Skin: within normal limits  Assessment/Plan Macula-involving retinal detachment, RIGHT EYE Lattice degeneration, LEFT EYE  Plan: To [facility] for SBP + 25g PPV w/ endolaser and gas, RIGHT EYE under general anesthesia; exam under anesthesia w/ possible laser, LEFT EYE - case scheduled for Thursday, 6.29.23, 1130 am -- [facility] OR 08   Karie Chimera, M.D., Ph.D. Vitreoretinal Surgeon [facility]

## 2021-08-12 ENCOUNTER — Encounter (HOSPITAL_COMMUNITY): Payer: Self-pay | Admitting: Ophthalmology

## 2021-08-12 NOTE — Progress Notes (Signed)
PCP - Dr Leodis Sias Cardiologist - n/a  Chest x-ray - n/a EKG - n/a Stress Test - n/a ECHO - n/a Cardiac Cath - n/a  ICD Pacemaker/Loop - n/a  Sleep Study -  n/a CPAP - none  STOP now taking any Aspirin (unless otherwise instructed by your surgeon), Aleve, Naproxen, Ibuprofen, Motrin, Advil, Goody's, BC's, all herbal medications, fish oil, and all vitamins.   Called patient, got voicemail.  Left a detailed message on machine with instructions for DOS.

## 2021-08-13 ENCOUNTER — Ambulatory Visit (HOSPITAL_BASED_OUTPATIENT_CLINIC_OR_DEPARTMENT_OTHER): Payer: 59 | Admitting: Anesthesiology

## 2021-08-13 ENCOUNTER — Encounter (HOSPITAL_COMMUNITY): Payer: Self-pay | Admitting: Ophthalmology

## 2021-08-13 ENCOUNTER — Ambulatory Visit (HOSPITAL_COMMUNITY)
Admission: RE | Admit: 2021-08-13 | Discharge: 2021-08-13 | Disposition: A | Discharge status: Home / Self Care | Payer: 59 | Attending: Ophthalmology | Admitting: Ophthalmology

## 2021-08-13 ENCOUNTER — Encounter (HOSPITAL_COMMUNITY)
Admission: RE | Disposition: A | Discharge status: Home / Self Care | Payer: Self-pay | Source: Home / Self Care | Attending: Ophthalmology

## 2021-08-13 ENCOUNTER — Ambulatory Visit (HOSPITAL_COMMUNITY): Payer: 59 | Admitting: Anesthesiology

## 2021-08-13 DIAGNOSIS — H3589 Other specified retinal disorders: Secondary | ICD-10-CM | POA: Diagnosis not present

## 2021-08-13 DIAGNOSIS — H3321 Serous retinal detachment, right eye: Secondary | ICD-10-CM | POA: Diagnosis not present

## 2021-08-13 DIAGNOSIS — H35412 Lattice degeneration of retina, left eye: Secondary | ICD-10-CM | POA: Insufficient documentation

## 2021-08-13 DIAGNOSIS — H33001 Unspecified retinal detachment with retinal break, right eye: Secondary | ICD-10-CM

## 2021-08-13 DIAGNOSIS — H33011 Retinal detachment with single break, right eye: Secondary | ICD-10-CM

## 2021-08-13 DIAGNOSIS — H33302 Unspecified retinal break, left eye: Secondary | ICD-10-CM | POA: Diagnosis not present

## 2021-08-13 DIAGNOSIS — H33021 Retinal detachment with multiple breaks, right eye: Secondary | ICD-10-CM | POA: Insufficient documentation

## 2021-08-13 HISTORY — PX: SCLERAL BUCKLE: SHX5340

## 2021-08-13 HISTORY — PX: VITRECTOMY 25 GAUGE WITH SCLERAL BUCKLE: SHX6183

## 2021-08-13 HISTORY — PX: EYE EXAMINATION UNDER ANESTHESIA: SHX1560

## 2021-08-13 HISTORY — PX: GAS/FLUID EXCHANGE: SHX5334

## 2021-08-13 HISTORY — DX: Other specified health status: Z78.9

## 2021-08-13 HISTORY — PX: LASER PHOTO ABLATION: SHX5942

## 2021-08-13 HISTORY — PX: PERFLUORONE INJECTION: SHX5302

## 2021-08-13 HISTORY — PX: GAS INSERTION: SHX5336

## 2021-08-13 LAB — CBC
HCT: 41.7 % (ref 36.0–46.0)
Hemoglobin: 13.9 g/dL (ref 12.0–15.0)
MCH: 33 pg (ref 26.0–34.0)
MCHC: 33.3 g/dL (ref 30.0–36.0)
MCV: 99 fL (ref 80.0–100.0)
Platelets: 299 10*3/uL (ref 150–400)
RBC: 4.21 MIL/uL (ref 3.87–5.11)
RDW: 12.6 % (ref 11.5–15.5)
WBC: 7.3 10*3/uL (ref 4.0–10.5)
nRBC: 0 % (ref 0.0–0.2)

## 2021-08-13 LAB — POCT PREGNANCY, URINE: Preg Test, Ur: NEGATIVE

## 2021-08-13 SURGERY — VITRECTOMY, USING 25-GAUGE INSTRUMENTS, WITH SCLERAL BUCKLING
Anesthesia: General | Site: Eye | Laterality: Right

## 2021-08-13 MED ORDER — BRIMONIDINE TARTRATE 0.2 % OP SOLN
OPHTHALMIC | Status: DC | PRN
  Administered 2021-08-13: 1 [drp] via OPHTHALMIC

## 2021-08-13 MED ORDER — EPINEPHRINE PF 1 MG/ML IJ SOLN
INTRAOCULAR | Status: DC | PRN
  Administered 2021-08-13: 500 mL

## 2021-08-13 MED ORDER — ATROPINE SULFATE 1 % OP SOLN
OPHTHALMIC | Status: AC
  Administered 2021-08-13: 1 [drp] via OPHTHALMIC
  Filled 2021-08-13: qty 5

## 2021-08-13 MED ORDER — ATROPINE SULFATE 1 % OP SOLN
OPHTHALMIC | Status: AC
  Filled 2021-08-13: qty 5

## 2021-08-13 MED ORDER — BSS PLUS IO SOLN
INTRAOCULAR | Status: AC
  Filled 2021-08-13: qty 500

## 2021-08-13 MED ORDER — FENTANYL CITRATE (PF) 250 MCG/5ML IJ SOLN
INTRAMUSCULAR | Status: AC
  Filled 2021-08-13: qty 5

## 2021-08-13 MED ORDER — BSS IO SOLN
INTRAOCULAR | Status: DC | PRN
  Administered 2021-08-13 (×2): 15 mL via INTRAOCULAR

## 2021-08-13 MED ORDER — TRIAMCINOLONE ACETONIDE 40 MG/ML IJ SUSP
INTRAMUSCULAR | Status: DC | PRN
  Administered 2021-08-13: 40 mg

## 2021-08-13 MED ORDER — LIDOCAINE HCL 1 % IJ SOLN
INTRAMUSCULAR | Status: AC
  Filled 2021-08-13: qty 20

## 2021-08-13 MED ORDER — BRIMONIDINE TARTRATE 0.2 % OP SOLN
OPHTHALMIC | Status: AC
Start: 2021-08-13 — End: ?
  Filled 2021-08-13: qty 5

## 2021-08-13 MED ORDER — PHENYLEPHRINE HCL 10 % OP SOLN
OPHTHALMIC | Status: AC
  Administered 2021-08-13: 1 [drp] via OPHTHALMIC
  Filled 2021-08-13: qty 5

## 2021-08-13 MED ORDER — OXYCODONE HCL 5 MG PO TABS: 5.0000 mg | ORAL_TABLET | Freq: Once | ORAL | Status: DC | PRN

## 2021-08-13 MED ORDER — STERILE WATER FOR INJECTION IJ SOLN
INTRAMUSCULAR | Status: DC | PRN
  Administered 2021-08-13: 250 mL

## 2021-08-13 MED ORDER — OXYCODONE HCL 5 MG/5ML PO SOLN: 5.0000 mg | Freq: Once | ORAL | Status: DC | PRN

## 2021-08-13 MED ORDER — BUPIVACAINE HCL (PF) 0.75 % IJ SOLN
INTRAMUSCULAR | Status: AC
  Filled 2021-08-13: qty 10

## 2021-08-13 MED ORDER — FENTANYL CITRATE (PF) 100 MCG/2ML IJ SOLN: 25.0000 ug | INTRAMUSCULAR | Status: DC | PRN

## 2021-08-13 MED ORDER — AMISULPRIDE (ANTIEMETIC) 5 MG/2ML IV SOLN: 10.0000 mg | Freq: Once | INTRAVENOUS | Status: DC | PRN

## 2021-08-13 MED ORDER — INDOCYANINE GREEN 25 MG IV SOLR
INTRAVENOUS | Status: AC
  Filled 2021-08-13: qty 10

## 2021-08-13 MED ORDER — BACITRACIN-POLYMYXIN B 500-10000 UNIT/GM OP OINT
TOPICAL_OINTMENT | OPHTHALMIC | Status: DC | PRN
  Administered 2021-08-13: 1 via OPHTHALMIC

## 2021-08-13 MED ORDER — ONDANSETRON HCL 4 MG/2ML IJ SOLN
INTRAMUSCULAR | Status: DC | PRN
  Administered 2021-08-13: 4 mg via INTRAVENOUS

## 2021-08-13 MED ORDER — PROPOFOL 10 MG/ML IV BOLUS
INTRAVENOUS | Status: DC | PRN
  Administered 2021-08-13: 120 mg via INTRAVENOUS
  Administered 2021-08-13: 30 mg via INTRAVENOUS
  Administered 2021-08-13: 50 mg via INTRAVENOUS

## 2021-08-13 MED ORDER — BUPIVACAINE HCL (PF) 0.75 % IJ SOLN
INTRAMUSCULAR | Status: DC | PRN
  Administered 2021-08-13: 5 mL

## 2021-08-13 MED ORDER — ATROPINE SULFATE 1 % OP SOLN
1.0000 [drp] | OPHTHALMIC | Status: AC | PRN
  Administered 2021-08-13 (×2): 1 [drp] via OPHTHALMIC

## 2021-08-13 MED ORDER — CHLORHEXIDINE GLUCONATE 0.12 % MT SOLN
OROMUCOSAL | Status: AC
  Administered 2021-08-13: 15 mL via OROMUCOSAL
  Filled 2021-08-13: qty 15

## 2021-08-13 MED ORDER — PREDNISOLONE ACETATE 1 % OP SUSP
OPHTHALMIC | Status: AC
  Filled 2021-08-13: qty 5

## 2021-08-13 MED ORDER — PREDNISOLONE ACETATE 1 % OP SUSP
OPHTHALMIC | Status: DC | PRN
  Administered 2021-08-13 (×2): 1 [drp] via OPHTHALMIC

## 2021-08-13 MED ORDER — ONDANSETRON HCL 4 MG/2ML IJ SOLN: 4.0000 mg | Freq: Once | INTRAMUSCULAR | Status: DC | PRN

## 2021-08-13 MED ORDER — TROPICAMIDE 1 % OP SOLN
1.0000 [drp] | OPHTHALMIC | Status: AC | PRN
  Administered 2021-08-13 (×2): 1 [drp] via OPHTHALMIC

## 2021-08-13 MED ORDER — TROPICAMIDE 1 % OP SOLN
OPHTHALMIC | Status: AC
  Administered 2021-08-13: 1 [drp] via OPHTHALMIC
  Filled 2021-08-13: qty 15

## 2021-08-13 MED ORDER — HYDROCODONE-ACETAMINOPHEN 5-325 MG PO TABS: 1.0000 | ORAL_TABLET | ORAL | 0 refills | Status: AC | PRN

## 2021-08-13 MED ORDER — POLYMYXIN B SULFATE 500000 UNITS IJ SOLR
INTRAMUSCULAR | Status: AC
  Filled 2021-08-13: qty 10

## 2021-08-13 MED ORDER — FENTANYL CITRATE (PF) 250 MCG/5ML IJ SOLN
INTRAMUSCULAR | Status: DC | PRN
  Administered 2021-08-13: 100 ug via INTRAVENOUS
  Administered 2021-08-13 (×2): 50 ug via INTRAVENOUS

## 2021-08-13 MED ORDER — ACETAMINOPHEN 500 MG PO TABS
ORAL_TABLET | ORAL | Status: AC
  Administered 2021-08-13: 1000 mg via ORAL
  Filled 2021-08-13: qty 2

## 2021-08-13 MED ORDER — NA CHONDROIT SULF-NA HYALURON 40-30 MG/ML IO SOSY
INTRAOCULAR | Status: DC | PRN
  Administered 2021-08-13: 0.5 mL via INTRAOCULAR

## 2021-08-13 MED ORDER — LIDOCAINE HCL (PF) 1 % IJ SOLN
INTRAMUSCULAR | Status: AC
  Filled 2021-08-13: qty 5

## 2021-08-13 MED ORDER — STERILE WATER FOR INJECTION IJ SOLN
INTRAMUSCULAR | Status: AC
  Filled 2021-08-13: qty 10

## 2021-08-13 MED ORDER — SUGAMMADEX SODIUM 200 MG/2ML IV SOLN
INTRAVENOUS | Status: DC | PRN
  Administered 2021-08-13: 200 mg via INTRAVENOUS

## 2021-08-13 MED ORDER — TOBRAMYCIN-DEXAMETHASONE 0.3-0.1 % OP OINT
TOPICAL_OINTMENT | OPHTHALMIC | Status: AC
  Filled 2021-08-13: qty 3.5

## 2021-08-13 MED ORDER — ROCURONIUM BROMIDE 10 MG/ML (PF) SYRINGE
PREFILLED_SYRINGE | INTRAVENOUS | Status: DC | PRN
  Administered 2021-08-13: 30 mg via INTRAVENOUS
  Administered 2021-08-13 (×2): 20 mg via INTRAVENOUS
  Administered 2021-08-13: 50 mg via INTRAVENOUS
  Administered 2021-08-13: 20 mg via INTRAVENOUS

## 2021-08-13 MED ORDER — PROPARACAINE HCL 0.5 % OP SOLN
OPHTHALMIC | Status: AC
  Administered 2021-08-13: 1 [drp] via OPHTHALMIC
  Filled 2021-08-13: qty 15

## 2021-08-13 MED ORDER — DORZOLAMIDE HCL-TIMOLOL MAL 2-0.5 % OP SOLN
OPHTHALMIC | Status: AC
  Filled 2021-08-13: qty 10

## 2021-08-13 MED ORDER — 0.9 % SODIUM CHLORIDE (POUR BTL) OPTIME
TOPICAL | Status: DC | PRN
  Administered 2021-08-13: 250 mL

## 2021-08-13 MED ORDER — DEXTROSE 5 % IV SOLN
INTRAVENOUS | Status: AC
  Filled 2021-08-13: qty 100

## 2021-08-13 MED ORDER — DEXAMETHASONE SODIUM PHOSPHATE 10 MG/ML IJ SOLN
INTRAMUSCULAR | Status: DC | PRN
  Administered 2021-08-13: 5 mg via INTRAVENOUS

## 2021-08-13 MED ORDER — CEFTAZIDIME 1 G IJ SOLR
INTRAMUSCULAR | Status: AC
  Filled 2021-08-13: qty 1

## 2021-08-13 MED ORDER — PROPARACAINE HCL 0.5 % OP SOLN
1.0000 [drp] | OPHTHALMIC | Status: AC | PRN
  Administered 2021-08-13 (×2): 1 [drp] via OPHTHALMIC

## 2021-08-13 MED ORDER — PHENYLEPHRINE HCL 10 % OP SOLN
1.0000 [drp] | OPHTHALMIC | Status: AC | PRN
  Administered 2021-08-13 (×2): 1 [drp] via OPHTHALMIC

## 2021-08-13 MED ORDER — LIDOCAINE 2% (20 MG/ML) 5 ML SYRINGE
INTRAMUSCULAR | Status: DC | PRN
  Administered 2021-08-13: 80 mg via INTRAVENOUS

## 2021-08-13 MED ORDER — NA CHONDROIT SULF-NA HYALURON 40-30 MG/ML IO SOSY
INTRAOCULAR | Status: AC
  Filled 2021-08-13: qty 1

## 2021-08-13 MED ORDER — GATIFLOXACIN 0.5 % OP SOLN
OPHTHALMIC | Status: AC
Start: 2021-08-13 — End: ?
  Filled 2021-08-13: qty 2.5

## 2021-08-13 MED ORDER — LIDOCAINE HCL (PF) 1 % IJ SOLN
INTRAMUSCULAR | Status: DC | PRN
  Administered 2021-08-13: 5 mL

## 2021-08-13 MED ORDER — STERILE WATER FOR INJECTION IJ SOLN
INTRAMUSCULAR | Status: DC | PRN
  Administered 2021-08-13: 20 mL

## 2021-08-13 MED ORDER — ACETAZOLAMIDE SODIUM 500 MG IJ SOLR
INTRAMUSCULAR | Status: AC
  Filled 2021-08-13: qty 500

## 2021-08-13 MED ORDER — MIDAZOLAM HCL 2 MG/2ML IJ SOLN
INTRAMUSCULAR | Status: DC | PRN
  Administered 2021-08-13: 2 mg via INTRAVENOUS

## 2021-08-13 MED ORDER — SODIUM CHLORIDE (PF) 0.9 % IJ SOLN
INTRAMUSCULAR | Status: AC
  Filled 2021-08-13: qty 10

## 2021-08-13 MED ORDER — PROPOFOL 10 MG/ML IV BOLUS
INTRAVENOUS | Status: AC
  Filled 2021-08-13: qty 20

## 2021-08-13 MED ORDER — LIDOCAINE HCL (PF) 2 % IJ SOLN
INTRAMUSCULAR | Status: AC
  Filled 2021-08-13: qty 10

## 2021-08-13 MED ORDER — BSS IO SOLN
INTRAOCULAR | Status: AC
  Filled 2021-08-13: qty 15

## 2021-08-13 MED ORDER — TRIAMCINOLONE ACETONIDE 40 MG/ML IJ SUSP
INTRAMUSCULAR | Status: AC
  Filled 2021-08-13: qty 5

## 2021-08-13 MED ORDER — LIDOCAINE HCL (PF) 4 % IJ SOLN
INTRAMUSCULAR | Status: AC
  Filled 2021-08-13: qty 5

## 2021-08-13 MED ORDER — CHLORHEXIDINE GLUCONATE 0.12 % MT SOLN
15.0000 mL | OROMUCOSAL | Status: AC
  Filled 2021-08-13: qty 15

## 2021-08-13 MED ORDER — ACETAMINOPHEN 500 MG PO TABS: 1000.0000 mg | ORAL_TABLET | Freq: Once | ORAL | Status: AC

## 2021-08-13 MED ORDER — DEXAMETHASONE SODIUM PHOSPHATE 10 MG/ML IJ SOLN
INTRAMUSCULAR | Status: AC
Start: 2021-08-13 — End: ?
  Filled 2021-08-13: qty 1

## 2021-08-13 MED ORDER — DORZOLAMIDE HCL-TIMOLOL MAL 2-0.5 % OP SOLN
OPHTHALMIC | Status: DC | PRN
  Administered 2021-08-13: 1 [drp] via OPHTHALMIC

## 2021-08-13 MED ORDER — BACITRACIN-POLYMYXIN B 500-10000 UNIT/GM OP OINT
TOPICAL_OINTMENT | OPHTHALMIC | Status: AC
  Filled 2021-08-13: qty 3.5

## 2021-08-13 MED ORDER — CARBACHOL 0.01 % IO SOLN
INTRAOCULAR | Status: AC
  Filled 2021-08-13: qty 1.5

## 2021-08-13 MED ORDER — GATIFLOXACIN 0.5 % OP SOLN
OPHTHALMIC | Status: DC | PRN
  Administered 2021-08-13: 1 [drp] via OPHTHALMIC

## 2021-08-13 MED ORDER — EPINEPHRINE PF 1 MG/ML IJ SOLN
INTRAMUSCULAR | Status: AC
  Filled 2021-08-13: qty 1

## 2021-08-13 MED ORDER — SODIUM CHLORIDE 0.9 % IV SOLN: INTRAVENOUS | Status: DC

## 2021-08-13 MED ORDER — MIDAZOLAM HCL 2 MG/2ML IJ SOLN
INTRAMUSCULAR | Status: AC
  Filled 2021-08-13: qty 2

## 2021-08-13 SURGICAL SUPPLY — 74 items
APL SWBSTK 6 STRL LF DISP (MISCELLANEOUS) ×8
APPLICATOR COTTON TIP 6 STRL (MISCELLANEOUS) ×12 IMPLANT
APPLICATOR COTTON TIP 6IN STRL (MISCELLANEOUS) ×12
BAG COUNTER SPONGE SURGICOUNT (BAG) ×4 IMPLANT
BAG SPNG CNTER NS LX DISP (BAG) ×2
BAND SCLERAL BUCKLING TYPE 41 (Ophthalmic Related) ×1 IMPLANT
BAND WRIST GAS GREEN (MISCELLANEOUS) IMPLANT
BETADINE 5% OPHTHALMIC (OPHTHALMIC) ×6 IMPLANT
BLADE EYE CATARACT 19 1.4 BEAV (BLADE) IMPLANT
BLADE MVR KNIFE 19G (BLADE) IMPLANT
BNDG EYE OVAL (GAUZE/BANDAGES/DRESSINGS) ×1 IMPLANT
CABLE BIPOLOR RESECTION CORD (MISCELLANEOUS) ×1 IMPLANT
CANNULA DUAL BORE 23G (CANNULA) ×1 IMPLANT
CANNULA FLEX TIP 25G (CANNULA) ×3 IMPLANT
COVER SURGICAL LIGHT HANDLE (MISCELLANEOUS) ×3 IMPLANT
DRAPE MICROSCOPE LEICA 46X105 (MISCELLANEOUS) ×3 IMPLANT
ERASER HMR WETFIELD 23G BP (MISCELLANEOUS) IMPLANT
FILTER BLUE MILLIPORE (MISCELLANEOUS) ×1 IMPLANT
FILTER STRAW FLUID ASPIR (MISCELLANEOUS) IMPLANT
FORCEPS GRIESHABER ILM 25G A (INSTRUMENTS) IMPLANT
GAS AUTO FILL CONSTEL (OPHTHALMIC) ×3
GAS AUTO FILL CONSTELLATION (OPHTHALMIC) IMPLANT
GAS IO PFP 125GM ISPAN CNSTL (MISCELLANEOUS) IMPLANT
GAS TANK CONSTELL (MISCELLANEOUS) ×3
GAS WRIST BAND GREEN (MISCELLANEOUS) ×3
GLOVE BIO SURGEON STRL SZ7.5 (GLOVE) ×6 IMPLANT
GLOVE BIOGEL M 7.0 STRL (GLOVE) ×3 IMPLANT
GOWN STRL REUS W/ TWL LRG LVL3 (GOWN DISPOSABLE) ×6 IMPLANT
GOWN STRL REUS W/ TWL XL LVL3 (GOWN DISPOSABLE) ×2 IMPLANT
GOWN STRL REUS W/TWL LRG LVL3 (GOWN DISPOSABLE) ×6
GOWN STRL REUS W/TWL XL LVL3 (GOWN DISPOSABLE) ×3
KIT BASIN OR (CUSTOM PROCEDURE TRAY) ×3 IMPLANT
KIT PERFLUORON PROCEDURE 5ML (MISCELLANEOUS) ×1 IMPLANT
KNIFE CRESCENT 1.75 EDGEAHEAD (BLADE) IMPLANT
KNIFE GRIESHABER SHARP 2.5MM (MISCELLANEOUS) ×3 IMPLANT
LENS BIOM SUPER VIEW SET DISP (MISCELLANEOUS) IMPLANT
LENS VITRECTOMY FLAT OCLR DISP (MISCELLANEOUS) IMPLANT
MICROPICK 25G (MISCELLANEOUS)
NDL 18GX1X1/2 (RX/OR ONLY) (NEEDLE) ×2 IMPLANT
NDL 25GX 5/8IN NON SAFETY (NEEDLE) ×8 IMPLANT
NDL HYPO 30X.5 LL (NEEDLE) ×4 IMPLANT
NEEDLE 18GX1X1/2 (RX/OR ONLY) (NEEDLE) ×3 IMPLANT
NEEDLE 25GX 5/8IN NON SAFETY (NEEDLE) ×12 IMPLANT
NEEDLE HYPO 30X.5 LL (NEEDLE) ×6 IMPLANT
NS IRRIG 1000ML POUR BTL (IV SOLUTION) ×3 IMPLANT
OPHTHALMIC BETADINE 5% (OPHTHALMIC) ×6
PACK VITRECTOMY CUSTOM (CUSTOM PROCEDURE TRAY) ×3 IMPLANT
PAD ARMBOARD 7.5X6 YLW CONV (MISCELLANEOUS) ×6 IMPLANT
PAK PIK VITRECTOMY CVS 25GA (OPHTHALMIC) ×4 IMPLANT
PIC ILLUMINATED 25G (OPHTHALMIC)
PICK MICROPICK 25G (MISCELLANEOUS) IMPLANT
PIK ILLUMINATED 25G (OPHTHALMIC) IMPLANT
PROBE DIATHERMY DSP 27GA (MISCELLANEOUS) IMPLANT
PROBE ENDO DIATHERMY 25G (MISCELLANEOUS) IMPLANT
PROBE LASER ILLUM FLEX CVD 25G (OPHTHALMIC) ×1 IMPLANT
REPL STRA BRUSH NDL (NEEDLE) IMPLANT
REPL STRA BRUSH NEEDLE (NEEDLE) IMPLANT
RESERVOIR BACK FLUSH (MISCELLANEOUS) IMPLANT
SCRAPER DIAMOND 25GA (OPHTHALMIC RELATED) IMPLANT
SUT ETHILON 5.0 S-24 (SUTURE) ×4 IMPLANT
SUT ETHILON 9 0 TG140 8 (SUTURE) IMPLANT
SUT SILK 2 0 (SUTURE) ×3
SUT SILK 2 0 TIES 17X18 (SUTURE) ×3
SUT SILK 2-0 18XBRD TIE 12 (SUTURE) ×2 IMPLANT
SUT SILK 2-0 18XBRD TIE BLK (SUTURE) ×2 IMPLANT
SUT VICRYL 7 0 TG140 8 (SUTURE) ×4 IMPLANT
SYR 10ML LL (SYRINGE) ×4 IMPLANT
SYR 20ML LL LF (SYRINGE) ×3 IMPLANT
SYR 5ML LL (SYRINGE) ×3 IMPLANT
SYR TB 1ML LUER SLIP (SYRINGE) ×3 IMPLANT
TOWEL GREEN STERILE FF (TOWEL DISPOSABLE) ×3 IMPLANT
TRAY FOLEY W/BAG SLVR 14FR (SET/KITS/TRAYS/PACK) ×5 IMPLANT
TUBING HIGH PRESS EXTEN 6IN (TUBING) IMPLANT
WATER STERILE IRR 1000ML POUR (IV SOLUTION) ×3 IMPLANT

## 2021-08-13 NOTE — Anesthesia Procedure Notes (Signed)
Procedure Name: Intubation Date/Time: 08/13/2021 11:54 AM  Performed by: Janace Litten, CRNAPre-anesthesia Checklist: Patient identified, Emergency Drugs available, Suction available and Patient being monitored Patient Re-evaluated:Patient Re-evaluated prior to induction Oxygen Delivery Method: Circle System Utilized Preoxygenation: Pre-oxygenation with 100% oxygen Induction Type: IV induction Ventilation: Mask ventilation without difficulty Laryngoscope Size: Mac and 3 Grade View: Grade I Tube type: Oral Tube size: 7.0 mm Number of attempts: 1 Airway Equipment and Method: Stylet and Oral airway Placement Confirmation: ETT inserted through vocal cords under direct vision, positive ETCO2 and breath sounds checked- equal and bilateral Secured at: 22 cm Tube secured with: Tape Dental Injury: Teeth and Oropharynx as per pre-operative assessment

## 2021-08-13 NOTE — Op Note (Signed)
Date of procedure:  08/13/2021   Surgeon: Bernarda Caffey, MD, PhD   Assistant: Ernest Mallick, Ophthalmic Assistant    Pre-operative Diagnoses:  Rhegmatogenous retinal detachment, RIGHT EYE Lattice degeneration with retinal defects, LEFT EYE   Post-operative diagnosis:  same   Anesthesia: General   Procedure:   1. Exam under anesthesia + laser retinopexy via indirect ophthalmoscopy, LEFT EYE 2. 25 gauge pars plana vitrectomy, RIGHT EYE 3. Perfluoron injection, RIGHT EYE 4. Fluid-Air Exchange, RIGHT EYE 5. Endolaser, RIGHT EYE 6. 14% C3F8 Gas Injection, RIGHT EYE     Indications for procedure: This is a 51 yo F with a history of lattice degeneration w/ retinal holes OD s/p laser retinopexy on 6.19.23 and a macula/fovea-involving retinal detachment OS. After discussing the risks, benefits, and alternatives to surgery, the patient electively decided to proceed with surgery of the right eye and an exam under anesthesia with possible laser retinopexy in the left eye, and informed consent was obtained. The left eye procedures were an attempt to re-examine the left eye and treat any new retinal pathologies and reinforce any of the previously treated retinal lesions.  The right eye procedures were an attempt to reattach the retina and improve the vision within the reasonable expectations of the surgeon.    Procedure in Detail:    The patient was met in the pre-operative holding area where their identification data was verified.  It was noted that there was a signed, informed consent in the chart and both eyes were verbally verified by the patient--left eye for exam under anesthesia w/ possible laser and right eye for surgery. Then the operative right eye and was marked with the surgeon's initials with a marking pen and the left eye was marked with letters "EUA." The patient was then taken to the operating room and placed in the supine position. General endotracheal anesthesia was induced.    Attention was first turned to the exam under anesthesia / indirect laser ophthalmoscopy portion of the procedure. The peripheral retina was inspect via indirect ophthalmoscopy with scleral depression in the LEFT eye. The previously treated areas of lattice from 5-6 oclock and 12-130 showed good barricade laser changes. The focal patch of lattice at 0730 did not have adequate laser changes surrounding and was reinforced with additional barricade laser retinopexy. Additionally, 2 punctate vitreoretinal tufts were identified at 1030 and 1100. Barricade laser retinopexy was performed in these 3 areas (0730 lattice and 1030, 1100 VR tufts) using the laser indirect ophthalmoscope: 596 spots were placed at 240 mW power, 50 ms duration. Following completion of laser retinopexy, one drop of prednisolone acetate 1% was applied to the left eye and the eyelid was taped closed.   The RIGHT EYE was then prepped with 5% betadine and draped in the normal fashion for ophthalmic surgery and a secondary time-out was performed to identify the correct patient, eyes, procedures, and any allergies.    Attention was then turned to the retinal detachment repair portion of the operation, starting with the scleral buckle procedure.   A 360 conjunctival peritomy was created using Westcott scissors and 0.12 forceps. Each of the four quadrants between the rectus muscles was dissected using Stevens scissors to detach Tenon's attachments from the globe. Each of the four rectus muscles was isolated on a muscle hook and slung using 2-0 Silk suture in the usual standard fashion. Each of the four quadrants between the rectus muscles was inspected and there were noted to be no areas of scleral thinning. A #41  silicone band was then brought onto the field and was threaded under each rectus muscle. The band was then loosely secured using a #70 Watzke sleeve in the superotemporal quadrant. The band was then sutured to the sclera in each quadrant  using 5-0 nylon sutures passed partial thickness through the sclera in a horizontal mattress fashion. The scleral buckle was then tightened to the appropriate height with two locking needle drivers. Attention was then turned to the vitrectomy portion of the procedure.   A 25 gauge trocar was placed in the inferotemporal quadrant in a beveled fashion. A 4 mm infusion cannula was placed through this trocar, and the infusion cannula was confirmed in the vitreous cavity with no incarceration of retina or choroid prior to turning it on. Two additional 25 gauge trocars were placed in the superonasal and superotemporal quadrants (2 and 10 oclock, respectively) in a similar beveled fashion. At this time, a standard three-port pars plana vitrectomy was performed using the light pipe, the cutter, and the BIOM viewing system. A thorough anterior, core and peripheral vitreous dissection was performed. Of note, an iatrogenic posterior capsulotomy was created during removal of the anterior vitreous. A posterior vitreous detachment was confirmed over the optic nerve. There was a bullous temporal retinal detachment extending from 0500-1200 w/ a complete macular detachment. There were multiple breaks within the detached retina: 2 breaks inferiorly at 7 and 730 oclock and 4 retinal breaks spanning 10-12 oclock.  Traction was removed from all retinal breaks. The breaks were trimmed using the cutter to smooth the edges. Perfluoron was injected to push the subretinal fluid anterior to the scleral buckle. Under perfluoron, endolaser was applied to the posterior edge of the buckle and around the breaks covered by perfluoron. A complete fluid-air exchange was performed with a soft tip extrusion cannula over the most posterior retinal break. Soon after initiating the fluid-air exchange, some air escaped into the anterior chamber, obscuring our view of the posterior pole. An anterior chamber paracentesis was performed using a 25g trocar  at 1030 oclock position of the limbus. A 30g cannula w/ BSS was used to remove the air bubbles from the anterior chamber, reinflate the anterior chamber and then rehydrate and seal the paracentesis. The complete fluid-air exchange was resumed over the retinal breaks, then posteriorly to remove the perfluoron. After completion of these maneuvers, the retina was flat over the macula and over the scleral buckle. Under air, endolaser was applied to all the breaks and over and posterior to the scleral buckle.             At this time, the buckle height was confirmed and the buckle was finalized by trimming the band ends. The superotemporal trocar was removed and sutured with 7-0 vicryl in an interrupted fashion.  A complete air to 14% C3F8 gas exchange was performed through the infusion cannula and vented through the superonasal trocar using the extrusion cannula. The superonasal trocar and infusion cannula and associated trocar were then removed and sutured with 7-0 vicryl in an interrupted fashion. Kefzol + polymixin irrigation was then used over the buckle. A subtenon's block containing 0.75% marcaine and 2% lidocaine was administered.              The conjunctiva was closed with 7-0 vicryl sutures. The eye's intraocular pressure was confirmed to be at a physiologic level by digital palpation. Subconjunctival injections of Antibiotic and kenalog were administered. The lid speculum and drapes were removed. Drops of an antibiotic, antihypertensives, and  steroid were given. Copious antibiotic ointment was instilled into the eye. The eye was patched and shielded. The patient tolerated the procedure well without any intraoperative or immediate postoperative complications. The patient was taken to the recovery room in good condition. The patient was instructed to maintain a strict face-down position and will be seen by Dr. Coralyn Pear tomorrow morning in clinic.

## 2021-08-13 NOTE — Anesthesia Postprocedure Evaluation (Signed)
Anesthesia Post Note  Patient: Isabella Morales  Procedure(s) Performed: VITRECTOMY 25 GAUGE (Right: Eye) SCLERAL BUCKLE PROCEDURE IN RIGHT EYE (Right: Eye) LASER PHOTO ABLATION (Right: Eye) LEFT EYE EXAM UNDER ANESTHESIA WITH LASER INDIRECT OPHTHALMOSCOPY (Left: Eye) GAS/FLUID EXCHANGE (Right: Eye) INSERTION OF GAS (Right: Eye) PERFLUORONE INJECTION (Right: Eye)     Patient location during evaluation: PACU Anesthesia Type: General Level of consciousness: awake and alert Pain management: pain level controlled Vital Signs Assessment: post-procedure vital signs reviewed and stable Respiratory status: spontaneous breathing, nonlabored ventilation, respiratory function stable and patient connected to nasal cannula oxygen Cardiovascular status: blood pressure returned to baseline and stable Postop Assessment: no apparent nausea or vomiting Anesthetic complications: no   No notable events documented.  Last Vitals:  Vitals:   08/13/21 1615 08/13/21 1630  BP: (!) 104/56 100/80  Pulse: 96 95  Resp: 15 11  Temp:  36.8 C  SpO2: 100% 98%    Last Pain:  Vitals:   08/13/21 1630  TempSrc:   PainSc: 0-No pain                 Earl Lites P Jazzlene Huot

## 2021-08-13 NOTE — Transfer of Care (Signed)
Immediate Anesthesia Transfer of Care Note  Patient: Isabella Morales  Procedure(s) Performed: VITRECTOMY 25 GAUGE (Right: Eye) SCLERAL BUCKLE PROCEDURE IN RIGHT EYE (Right: Eye) LASER PHOTO ABLATION (Right: Eye) LEFT EYE EXAM UNDER ANESTHESIA WITH LASER INDIRECT OPHTHALMOSCOPY (Left: Eye) GAS/FLUID EXCHANGE (Right: Eye) INSERTION OF GAS (Right: Eye) PERFLUORONE INJECTION (Right: Eye)  Patient Location: PACU  Anesthesia Type:General  Level of Consciousness: drowsy, patient cooperative and responds to stimulation  Airway & Oxygen Therapy: Patient Spontanous Breathing  Post-op Assessment: Report given to RN and Post -op Vital signs reviewed and stable  Post vital signs: Reviewed and stable  Last Vitals:  Vitals Value Taken Time  BP 112/62 08/13/21 1541  Temp    Pulse 110 08/13/21 1544  Resp 15 08/13/21 1544  SpO2 97 % 08/13/21 1544  Vitals shown include unvalidated device data.  Last Pain:  Vitals:   08/13/21 0919  TempSrc:   PainSc: 0-No pain      Patients Stated Pain Goal: 0 (08/13/21 0919)  Complications: No notable events documented.

## 2021-08-13 NOTE — Discharge Instructions (Addendum)
POSTOPERATIVE INSTRUCTIONS  Your doctor has performed vitreoretinal surgery on you at [place].  - Keep eye patched and shielded until seen by Dr. Zamora 8 AM tomorrow in clinic - Do not use drops until return - FACE DOWN POSITIONING WHILE AWAKE - Sleep with belly down or on left side, avoid laying flat on back.    - No strenuous bending, stooping or lifting.  - You may not drive until further notice.  - If your doctor used a gas bubble in your eye during the procedure he will advise you on postoperative positioning. If you have a gas bubble you will be wearing a green bracelet that was applied in the operating room. The green bracelet should stay on as long as the gas bubble is in your eye. While the gas bubble is present you should not fly in an airplane. If you require general anesthesia while the gas bubble is present you must notify your anesthesiologist that an intraocular gas bubble is present so he can take the appropriate precautions.  - Tylenol or any other over-the-counter pain reliever can be used according to your doctor. If more pain medicine is required, your doctor will have a prescription for you.  - You may read, go up and down stairs, and watch television.     Brian Zamora, M.D., Ph.D.  

## 2021-08-13 NOTE — Brief Op Note (Signed)
08/13/2021  3:58 PM  PATIENT:  [name]  51 y.o. 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, LEFT EYE  PROCEDURE:  Procedure(s): VITRECTOMY 25 GAUGE (Right) SCLERAL BUCKLE PROCEDURE IN RIGHT EYE (Right) LASER PHOTO ABLATION (Right) LEFT EYE EXAM UNDER ANESTHESIA WITH LASER INDIRECT OPHTHALMOSCOPY (Left) GAS/FLUID EXCHANGE (Right) INSERTION OF GAS (Right) PERFLUORONE INJECTION (Right)  SURGEON:  Surgeon(s) and Role:    Rennis Chris, MD - Primary  ASSISTANTS: Laurian Brim, Ophthalmic Assistant    ANESTHESIA:   local and general  EBL:  minimal   BLOOD ADMINISTERED:none  DRAINS: none   LOCAL MEDICATIONS USED:  BUPIVICAINE , LIDOCAINE , and Amount: 10 ml  SPECIMEN:  No Specimen  DISPOSITION OF SPECIMEN:  N/A  COUNTS:  YES  TOURNIQUET:  * No tourniquets in log *  DICTATION: .Note written in EPIC  PLAN OF CARE: Discharge to home after PACU  PATIENT DISPOSITION:  PACU - hemodynamically stable.   Delay start of Pharmacological VTE agent (>24hrs) due to surgical blood loss or risk of bleeding: not applicable

## 2021-08-13 NOTE — Interval H&P Note (Signed)
History and Physical Interval Note:  08/13/2021 11:31 AM  [name]  has presented today for surgery, with the diagnosis of right eye retinal detachment.  The various methods of treatment have been discussed with the patient and family. After consideration of risks, benefits and other options for treatment, the patient has consented to  Procedure(s): VITRECTOMY 25 GAUGE WITH SCLERAL BUCKLE (Right) as a surgical intervention.  [name] history has been reviewed, patient examined, no change in status, stable for surgery.  I have reviewed the patient's chart and labs.  Questions were answered to the patient's satisfaction.     Isabella Morales

## 2021-08-13 NOTE — Anesthesia Preprocedure Evaluation (Addendum)
Anesthesia Evaluation  Patient identified by MRN, date of birth, ID band Patient awake    Reviewed: Allergy & Precautions, NPO status , Patient's Chart, lab work & pertinent test results  Airway Mallampati: I  TM Distance: >3 FB Neck ROM: Full    Dental no notable dental hx. (+) Dental Advisory Given, Teeth Intact   Pulmonary neg pulmonary ROS,    Pulmonary exam normal breath sounds clear to auscultation       Cardiovascular negative cardio ROS Normal cardiovascular exam Rhythm:Regular Rate:Normal     Neuro/Psych negative neurological ROS     GI/Hepatic negative GI ROS, Neg liver ROS,   Endo/Other  negative endocrine ROS  Renal/GU negative Renal ROS     Musculoskeletal negative musculoskeletal ROS (+)   Abdominal   Peds  Hematology negative hematology ROS (+)   Anesthesia Other Findings   Reproductive/Obstetrics negative OB ROS                            Anesthesia Physical Anesthesia Plan  ASA: 1  Anesthesia Plan: General   Post-op Pain Management: Tylenol PO (pre-op)*   Induction: Intravenous  PONV Risk Score and Plan: 3 and Ondansetron, Treatment may vary due to age or medical condition, Midazolam and Dexamethasone  Airway Management Planned: Oral ETT  Additional Equipment:   Intra-op Plan:   Post-operative Plan: Extubation in OR  Informed Consent: I have reviewed the patients History and Physical, chart, labs and discussed the procedure including the risks, benefits and alternatives for the proposed anesthesia with the patient or authorized representative who has indicated his/her understanding and acceptance.     Dental advisory given  Plan Discussed with: CRNA  Anesthesia Plan Comments:        Anesthesia Quick Evaluation

## 2021-08-13 NOTE — Progress Notes (Signed)
Green gas bracelet place on right wrist  

## 2021-08-14 ENCOUNTER — Encounter (INDEPENDENT_AMBULATORY_CARE_PROVIDER_SITE_OTHER): Payer: Self-pay | Admitting: Ophthalmology

## 2021-08-14 ENCOUNTER — Ambulatory Visit (INDEPENDENT_AMBULATORY_CARE_PROVIDER_SITE_OTHER): Payer: 59 | Admitting: Ophthalmology

## 2021-08-14 DIAGNOSIS — H25813 Combined forms of age-related cataract, bilateral: Secondary | ICD-10-CM

## 2021-08-14 DIAGNOSIS — H35412 Lattice degeneration of retina, left eye: Secondary | ICD-10-CM

## 2021-08-14 DIAGNOSIS — H33323 Round hole, bilateral: Secondary | ICD-10-CM

## 2021-08-14 DIAGNOSIS — H3321 Serous retinal detachment, right eye: Secondary | ICD-10-CM

## 2021-08-14 NOTE — Progress Notes (Signed)
[facility] Note  08/14/2021     CHIEF COMPLAINT Patient presents for Post-op Follow-up   HISTORY OF PRESENT ILLNESS: Isabella Morales is a 51 y.o. female who presents to the clinic today for:   HPI     Post-op Follow-up   In right eye.  Discomfort includes pain.  Negative for itching, foreign body sensation, tearing, discharge, floaters and none.  Vision is worse.  I, the attending physician,  performed the HPI with the patient and updated documentation appropriately.        Comments   Pt presents for one day PO for RD OS, she states eye is a little painful, slept a little last night, took pain medication, keeping head down      Last edited by Bernarda Caffey, MD on 08/14/2021  8:49 AM.    Pt states she was "a little uncomfortable" last night, slept in 30 minute increments, used Rx pain medication  Referring physician: Vernie Shanks, MD [place],  [place] [address]  HISTORICAL INFORMATION:   Selected notes from the MEDICAL RECORD NUMBER Referred by [facility] for mac off RD LEE:  Ocular Hx- PMH-    CURRENT MEDICATIONS: No current outpatient medications on file. (Ophthalmic Drugs)   No current facility-administered medications for this visit. (Ophthalmic Drugs)   Current Outpatient Medications (Other)  Medication Sig   CALCIUM PO Take 2 tablets by mouth daily. Gummie   HYDROcodone-acetaminophen (NORCO/VICODIN) 5-325 MG tablet Take 1 tablet by mouth every 4 (four) hours as needed for moderate pain.   No current facility-administered medications for this visit. (Other)   REVIEW OF SYSTEMS: ROS   Positive for: Eyes Negative for: Constitutional, Gastrointestinal, Neurological, Skin, Genitourinary, Musculoskeletal, HENT, Endocrine, Cardiovascular, Respiratory, Psychiatric, Allergic/Imm, Heme/Lymph Last edited by Debbrah Alar, COT on 08/14/2021  8:20 AM.     ALLERGIES No Known Allergies  PAST MEDICAL HISTORY Past  Medical History:  Diagnosis Date   Medical history non-contributory    History reviewed. No pertinent surgical history.  FAMILY HISTORY Family History  Problem Relation Age of Onset   Diabetes Mother    Heart disease Father    SOCIAL HISTORY Social History   Tobacco Use   Smoking status: Never   Smokeless tobacco: Never  Substance Use Topics   Alcohol use: Yes   Drug use: Not Currently       OPHTHALMIC EXAM:  Base Eye Exam     Visual Acuity (Snellen - Linear)       Right Left   Dist [place] HM 20/25 -1         Tonometry (Tonopen, 8:24 AM)       Right Left   Pressure 29 23         Neuro/Psych     Oriented x3: Yes   Mood/Affect: Normal         Dilation     Right eye: 1.0% Mydriacyl, 2.5% Phenylephrine @ 8:25 AM           Slit Lamp and Fundus Exam     External Exam       Right Left   External Periorbital edema          Slit Lamp Exam       Right Left   Lids/Lashes Normal Dermatochalasis - upper lid   Conjunctiva/Sclera Subconjunctival hemorrhage 360, sutures intact White and quiet   Cornea epi defect, endo heme trace PEE   Anterior Chamber deep,  mild hyphema, early fibrin reaction, 2+cell deep, clear   Iris Round and dilated Round and dilated   Lens 2+ Nuclear sclerosis, 2+ Cortical cataract, 3+ PC feathering, posterior capsulotomy 2+ Nuclear sclerosis, 2+ Cortical cataract   Anterior Vitreous post vitrectomy, good gas fill Vitreous syneresis         Fundus Exam       Right Left   Disc hazy view, perfused Pink and Sharp   C/D Ratio 0.1 0.1   Macula hazy view, flat under gas Flat, Good foveal reflex, No heme or edema   Vessels Tortuous, mild AV crossing changes attenuated, Tortuous   Periphery Hazy view, good buckle height, good early laser changes over buckle, ORIGINALLY: bullous RD from 9381-0175 with retinal tears at 1030, 1100, 1200 and 0700 Attached, pigment lattice from 0500-0600 with retinal holes at 0500 and 0600 with shallow  SRF, focal lattice at 0730, pigmented lattice at 0100 and 0130 - no SRF; VR tufts at 1030 and 11; all lesion with good early laser changes surrounding           IMAGING AND PROCEDURES  Imaging and Procedures for 08/14/2021          ASSESSMENT/PLAN:    ICD-10-CM   1. 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 Retinal hole of both eyes  H33.323     4. 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, onset of foveal involvement 6.14.23 by pt history -- maybe sooner - detached temporally from 0600-1230 oclock, fovea off, +corrugations - 4 tears within detachment -- 0730, 1030, 1100, and 1200; +lattice degen within detachment also - POD1 s/p PPV/PFO/EL/FAX/14% C3F8 OD, 06.29.23             - doing well this morning             - retina attached and in good position under gas-- good buckle height and laser around breaks             - IOP elevated at 29             - start   PF q2h OD                          zymaxid QID OD                          Atropine BID OD                          Brimonidine BID OD                          Cosopt BID OD                         PSO ung QID OD              - cont face down positioning x3 days; avoid laying flat on back              - eye shield when sleeping              - post op drop and positioning instructions  reviewed              - tylenol/ibuprofen for pain              - Rx given for breakthrough pain  - f/u next Thursday, DFE OU  2,3. Lattice degeneration w/ atrophic holes, left eye - pigment lattice from 0500-0600 with retinal holes at 0500 and 0600 with shallow SRF; focal lattice at 0730, pigmented lattice from 0100 and 0130 - no SRF - s/p laser retinopexy OS (06.19.23) - s/p touch up laser in OR (06.29.23) -- good early laser changes around 1030 and 1100 VR tufts - next Thursday  -- POV  4. Mixed Cataract OU - The symptoms of cataract, surgical options, and treatments and risks were discussed with patient. - discussed diagnosis and progression -- specifically post vitrectomy progression - monitor  Ophthalmic Meds Ordered this visit:  No orders of the defined types were placed in this encounter.    Return in about 6 days (around 08/20/2021) for f/u POV RD OD / lattice OS, DFE OU.  There are no Patient Instructions on file for this visit.   Explained the diagnoses, plan, and follow up with the patient and they expressed understanding.  Patient expressed understanding of the importance of proper follow up care.   This document serves as a record of services personally performed by Gardiner Sleeper, MD, PhD. It was created on their behalf by San Jetty. Owens Shark, OA an ophthalmic technician. The creation of this record is the provider's dictation and/or activities during the visit.    Electronically signed by: San Jetty. Owens Shark, [place] 06.30.2023 8:52 AM   Gardiner Sleeper, M.D., Ph.D. Diseases & Surgery of the Retina and Vitreous [facility]  I have reviewed the above documentation for accuracy and completeness, and I agree with the above. Gardiner Sleeper, M.D., Ph.D. 08/14/21 8:53 AM   Abbreviations: M myopia (nearsighted); A astigmatism; H hyperopia (farsighted); P presbyopia; Mrx spectacle prescription;  CTL contact lenses; OD right eye; OS left eye; OU both eyes  XT exotropia; ET esotropia; PEK punctate epithelial keratitis; PEE punctate epithelial erosions; DES dry eye syndrome; MGD meibomian gland dysfunction; ATs artificial tears; PFAT's preservative free artificial tears; [place] nuclear sclerotic cataract; PSC posterior subcapsular cataract; ERM epi-retinal membrane; PVD posterior vitreous detachment; RD retinal detachment; DM diabetes mellitus; DR diabetic retinopathy; NPDR non-proliferative diabetic retinopathy; PDR proliferative diabetic  retinopathy; CSME clinically significant macular edema; DME diabetic macular edema; dbh dot blot hemorrhages; CWS cotton wool spot; POAG primary open angle glaucoma; C/D cup-to-disc ratio; HVF humphrey visual field; GVF goldmann visual field; OCT optical coherence tomography; IOP intraocular pressure; BRVO Branch retinal vein occlusion; CRVO central retinal vein occlusion; CRAO central retinal artery occlusion; BRAO branch retinal artery occlusion; RT retinal tear; SB scleral buckle; PPV pars plana vitrectomy; VH Vitreous hemorrhage; PRP panretinal laser photocoagulation; IVK intravitreal kenalog; VMT vitreomacular traction; MH Macular hole;  NVD neovascularization of the disc; NVE neovascularization elsewhere; AREDS age related eye disease study; ARMD age related macular degeneration; POAG primary open angle glaucoma; EBMD epithelial/anterior basement membrane dystrophy; ACIOL anterior chamber intraocular lens; IOL intraocular lens; PCIOL posterior chamber intraocular lens; Phaco/IOL phacoemulsification with intraocular lens placement; [place] photorefractive keratectomy; LASIK laser assisted in situ keratomileusis; HTN hypertension; DM diabetes mellitus; COPD chronic obstructive pulmonary disease

## 2021-08-19 NOTE — Progress Notes (Signed)
[facility] Note  08/20/2021     CHIEF COMPLAINT Patient presents for Retina Follow Up   HISTORY OF PRESENT ILLNESS: Isabella Morales is a 51 y.o. female who presents to the clinic today for:   HPI     Retina Follow Up   Patient presents with  Retinal Break/Detachment.  In right eye.  Severity is moderate.  Duration of 1 week.  Since onset it is stable.  I, the attending physician,  performed the HPI with the patient and updated documentation appropriately.        Comments   Pt here for 1 wk POV RD OD/lattice OS. Pt states VA is about the same, not worse or better. Pt reports being compliant w/ drops and ointment usage.       Last edited by Bernarda Caffey, MD on 08/20/2021  3:19 PM.     Pt states she is keeping up with all the drops, her eye does not hurt anymore, just a little scratchy, she is no longer using pain medication, she is keeping her head down most of the day  Referring physician: Vernie Shanks, MD [place],  [place] [address]  HISTORICAL INFORMATION:   Selected notes from the MEDICAL RECORD NUMBER Referred by [facility] for mac off RD LEE:  Ocular Hx- PMH-    CURRENT MEDICATIONS: Current Outpatient Medications (Ophthalmic Drugs)  Medication Sig   bacitracin-polymyxin b (POLYSPORIN) ophthalmic ointment Place into the right eye 4 (four) times daily. Place a 1/2 inch ribbon of ointment into the lower eyelid.   brimonidine (ALPHAGAN) 0.2 % ophthalmic solution Place 1 drop into the right eye 2 (two) times daily.   prednisoLONE acetate (PRED FORTE) 1 % ophthalmic suspension Place 1 drop into the right eye 4 (four) times daily.   No current facility-administered medications for this visit. (Ophthalmic Drugs)   Current Outpatient Medications (Other)  Medication Sig   CALCIUM PO Take 2 tablets by mouth daily. Gummie   HYDROcodone-acetaminophen (NORCO/VICODIN) 5-325 MG tablet Take 1 tablet by mouth every 4 (four)  hours as needed for moderate pain.   No current facility-administered medications for this visit. (Other)   REVIEW OF SYSTEMS: ROS   Positive for: Eyes Negative for: Constitutional, Gastrointestinal, Neurological, Skin, Genitourinary, Musculoskeletal, HENT, Endocrine, Cardiovascular, Respiratory, Psychiatric, Allergic/Imm, Heme/Lymph Last edited by Kingsley Spittle, COT on 08/20/2021  8:06 AM.     ALLERGIES No Known Allergies  PAST MEDICAL HISTORY Past Medical History:  Diagnosis Date   Medical history non-contributory    Past Surgical History:  Procedure Laterality Date   EYE EXAMINATION UNDER ANESTHESIA Left 08/13/2021   Procedure: LEFT EYE EXAM UNDER ANESTHESIA WITH LASER INDIRECT OPHTHALMOSCOPY;  Surgeon: Bernarda Caffey, MD;  Location: [place];  Service: Ophthalmology;  Laterality: Left;   GAS INSERTION Right 08/13/2021   Procedure: INSERTION OF GAS;  Surgeon: Bernarda Caffey, MD;  Location: [place];  Service: Ophthalmology;  Laterality: Right;   GAS/FLUID EXCHANGE Right 08/13/2021   Procedure: GAS/FLUID EXCHANGE;  Surgeon: Bernarda Caffey, MD;  Location: [place];  Service: Ophthalmology;  Laterality: Right;   LASER PHOTO ABLATION Right 08/13/2021   Procedure: LASER PHOTO ABLATION;  Surgeon: Bernarda Caffey, MD;  Location: [place];  Service: Ophthalmology;  Laterality: Right;   PERFLUORONE INJECTION Right 08/13/2021   Procedure: PERFLUORONE INJECTION;  Surgeon: Bernarda Caffey, MD;  Location: [place];  Service: Ophthalmology;  Laterality: Right;   SCLERAL BUCKLE Right 08/13/2021   Procedure: SCLERAL  BUCKLE PROCEDURE IN RIGHT EYE;  Surgeon: Bernarda Caffey, MD;  Location: [place];  Service: Ophthalmology;  Laterality: Right;   VITRECTOMY 25 GAUGE WITH SCLERAL BUCKLE Right 08/13/2021   Procedure: VITRECTOMY 25 GAUGE;  Surgeon: Bernarda Caffey, MD;  Location: [place];  Service: Ophthalmology;  Laterality: Right;   FAMILY HISTORY Family History  Problem Relation Age of Onset   Diabetes Mother    Heart disease  Father    SOCIAL HISTORY Social History   Tobacco Use   Smoking status: Never   Smokeless tobacco: Never  Substance Use Topics   Alcohol use: Yes   Drug use: Not Currently       OPHTHALMIC EXAM:  Base Eye Exam     Visual Acuity (Snellen - Linear)       Right Left   Dist [place] CF @ 3' 20/30   Dist ph [place] NI 20/25         Tonometry (Tonopen, 8:14 AM)       Right Left   Pressure 11 18         Pupils       Dark Light Shape React APD   Right Dilated   NR    Left 4 3 Round Brisk None         Visual Fields       Left Right    Full    Restrictions  Partial outer superior temporal, inferior temporal deficiencies         Extraocular Movement       Right Left    Full, Ortho Full, Ortho         Neuro/Psych     Oriented x3: Yes   Mood/Affect: Normal         Dilation     Both eyes: 1.0% Mydriacyl, 2.5% Phenylephrine @ 8:15 AM           Slit Lamp and Fundus Exam     External Exam       Right Left   External Periorbital edema          Slit Lamp Exam       Right Left   Lids/Lashes Normal Dermatochalasis - upper lid   Conjunctiva/Sclera Subconjunctival hemorrhage 360, - improving, sutures intact White and quiet   Cornea epi defect - closing, endo mild heme inferiorly, 2+ Punctate epithelial erosions trace PEE   Anterior Chamber Moderate depth, mild hyphema - improved, now just mild endo heme, early fibrin reaction - resolved, no cell, narrow temporal angle deep, clear   Iris irregular dilation Round and dilated   Lens 2+ Nuclear sclerosis, 2+ Cortical cataract, 1-2+ PC feathering, posterior capsulotomy 2+ Nuclear sclerosis, 2+ Cortical cataract   Anterior Vitreous post vitrectomy, good gas fill Vitreous syneresis         Fundus Exam       Right Left   Disc hazy view, perfused Pink and Sharp   C/D Ratio 0.1 0.1   Macula hazy view, flat under gas Flat, Good foveal reflex, No heme or edema   Vessels Tortuous, mild AV crossing changes  attenuated, Tortuous   Periphery Hazy view, good buckle height, good early laser changes over buckle, ORIGINALLY: bullous RD from 7867-6720 with retinal tears at 1030, 1100, 1200 and 0700 Attached, pigment lattice from 0500-0600 with retinal holes at 0500 and 0600 with shallow SRF, focal lattice at 0730, pigmented lattice at 0100 and 0130 - no SRF; VR tufts at 1030 and 11; all lesion with good  laser changes surrounding, no new RT/RD           IMAGING AND PROCEDURES  Imaging and Procedures for 08/20/2021          ASSESSMENT/PLAN:    ICD-10-CM   1. 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 Retinal hole of both eyes  H33.323     4. 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, onset of foveal involvement 6.14.23 by pt history -- maybe earlier - detached temporally from 0600-1230 oclock, fovea off, +corrugations - 4 tears within detachment -- 0730, 1030, 1100, and 1200; +lattice degen within detachment also - POW1 s/p SBP + PPV/PFO/EL/FAX/14% C3F8 OD, 06.29.23             - doing well              - retina attached and in good position under gas-- good buckle height and laser around breaks             - IOP improved to 11  - fibrin rxn improved; mild hyphema/endoheme remains             - cont   PF QID from q2h OD, stop from QID OS                         zymaxid QID OD -- stop on Monday, July 10                         Atropine BID OD                          Brimonidine BID OD                          Cosopt BID OD -- okay to stop now                         PSO ung QID OD              - cont face down positioning 30 min/hr; avoid laying flat on back              - eye shield when sleeping x1 more week             - post op drop and positioning instructions reviewed              - tylenol/ibuprofen for pain              - Rx  given for breakthrough pain  - f/u 1 week, DFE OU  2,3. Lattice degeneration w/ atrophic holes, left eye - pigment lattice from 0500-0600 with retinal holes at 0500 and 0600 with shallow SRF; focal lattice at 0730, pigmented lattice from 0100 and 0130 - no SRF - s/p laser retinopexy OS (06.19.23) - s/p touch up laser in OR (06.29.23) -- good laser changes around 1030 and 1100 VR tufts - monitor  4. Mixed Cataract OU - The symptoms of cataract, surgical options, and treatments and risks were discussed with patient. - discussed diagnosis and progression -- specifically post vitrectomy progression - monitor  Ophthalmic Meds Ordered this visit:  Meds ordered this encounter  Medications   prednisoLONE acetate (PRED FORTE) 1 % ophthalmic suspension    Sig: Place 1 drop into the right eye 4 (four) times daily.    Dispense:  15 mL    Refill:  1   bacitracin-polymyxin b (POLYSPORIN) ophthalmic ointment    Sig: Place into the right eye 4 (four) times daily. Place a 1/2 inch ribbon of ointment into the lower eyelid.    Dispense:  3.5 g    Refill:  3   brimonidine (ALPHAGAN) 0.2 % ophthalmic solution    Sig: Place 1 drop into the right eye 2 (two) times daily.    Dispense:  10 mL    Refill:  1     Return in about 1 week (around 08/27/2021) for f/u RD OD, DFE OU.  There are no Patient Instructions on file for this visit.   Explained the diagnoses, plan, and follow up with the patient and they expressed understanding.  Patient expressed understanding of the importance of proper follow up care.   This document serves as a record of services personally performed by Gardiner Sleeper, MD, PhD. It was created on their behalf by San Jetty. Owens Shark, OA an ophthalmic technician. The creation of this record is the provider's dictation and/or activities during the visit.    Electronically signed by: San Jetty. [place], [place] 07.05.2023 3:21 PM  Gardiner Sleeper, M.D., Ph.D. Diseases & Surgery of the Retina  and Vitreous [facility]  I have reviewed the above documentation for accuracy and completeness, and I agree with the above. Gardiner Sleeper, M.D., Ph.D. 08/20/21 3:25 PM   Abbreviations: M myopia (nearsighted); A astigmatism; H hyperopia (farsighted); P presbyopia; Mrx spectacle prescription;  CTL contact lenses; OD right eye; OS left eye; OU both eyes  XT exotropia; ET esotropia; PEK punctate epithelial keratitis; PEE punctate epithelial erosions; DES dry eye syndrome; MGD meibomian gland dysfunction; ATs artificial tears; PFAT's preservative free artificial tears; [place] nuclear sclerotic cataract; PSC posterior subcapsular cataract; ERM epi-retinal membrane; PVD posterior vitreous detachment; RD retinal detachment; DM diabetes mellitus; DR diabetic retinopathy; NPDR non-proliferative diabetic retinopathy; PDR proliferative diabetic retinopathy; CSME clinically significant macular edema; DME diabetic macular edema; dbh dot blot hemorrhages; CWS cotton wool spot; POAG primary open angle glaucoma; C/D cup-to-disc ratio; HVF humphrey visual field; GVF goldmann visual field; OCT optical coherence tomography; IOP intraocular pressure; BRVO Branch retinal vein occlusion; CRVO central retinal vein occlusion; CRAO central retinal artery occlusion; BRAO branch retinal artery occlusion; RT retinal tear; SB scleral buckle; PPV pars plana vitrectomy; VH Vitreous hemorrhage; PRP panretinal laser photocoagulation; IVK intravitreal kenalog; VMT vitreomacular traction; MH Macular hole;  NVD neovascularization of the disc; NVE neovascularization elsewhere; AREDS age related eye disease study; ARMD age related macular degeneration; POAG primary open angle glaucoma; EBMD epithelial/anterior basement membrane dystrophy; ACIOL anterior chamber intraocular lens; IOL intraocular lens; PCIOL posterior chamber intraocular lens; Phaco/IOL phacoemulsification with intraocular lens placement; [place] photorefractive  keratectomy; LASIK laser assisted in situ keratomileusis; HTN hypertension; DM diabetes mellitus; COPD chronic obstructive pulmonary disease

## 2021-08-20 ENCOUNTER — Ambulatory Visit (INDEPENDENT_AMBULATORY_CARE_PROVIDER_SITE_OTHER): Payer: 59 | Admitting: Ophthalmology

## 2021-08-20 ENCOUNTER — Encounter (INDEPENDENT_AMBULATORY_CARE_PROVIDER_SITE_OTHER): Payer: Self-pay | Admitting: Ophthalmology

## 2021-08-20 DIAGNOSIS — H3321 Serous retinal detachment, right eye: Secondary | ICD-10-CM

## 2021-08-20 DIAGNOSIS — H35412 Lattice degeneration of retina, left eye: Secondary | ICD-10-CM

## 2021-08-20 DIAGNOSIS — H25813 Combined forms of age-related cataract, bilateral: Secondary | ICD-10-CM

## 2021-08-20 DIAGNOSIS — H33323 Round hole, bilateral: Secondary | ICD-10-CM

## 2021-08-20 MED ORDER — BACITRACIN-POLYMYXIN B 500-10000 UNIT/GM OP OINT: TOPICAL_OINTMENT | Freq: Four times a day (QID) | OPHTHALMIC | 3 refills | Status: AC

## 2021-08-20 MED ORDER — BRIMONIDINE TARTRATE 0.2 % OP SOLN: 1.0000 [drp] | Freq: Two times a day (BID) | OPHTHALMIC | 1 refills | Status: AC

## 2021-08-20 MED ORDER — PREDNISOLONE ACETATE 1 % OP SUSP: 1.0000 [drp] | Freq: Four times a day (QID) | OPHTHALMIC | 1 refills | Status: DC

## 2021-08-27 ENCOUNTER — Ambulatory Visit (INDEPENDENT_AMBULATORY_CARE_PROVIDER_SITE_OTHER): Payer: 59 | Admitting: Ophthalmology

## 2021-08-27 DIAGNOSIS — H35412 Lattice degeneration of retina, left eye: Secondary | ICD-10-CM

## 2021-08-27 DIAGNOSIS — H25813 Combined forms of age-related cataract, bilateral: Secondary | ICD-10-CM

## 2021-08-27 DIAGNOSIS — H3321 Serous retinal detachment, right eye: Secondary | ICD-10-CM

## 2021-08-27 DIAGNOSIS — H33323 Round hole, bilateral: Secondary | ICD-10-CM

## 2021-08-27 NOTE — Progress Notes (Signed)
[facility] Note  08/27/2021     CHIEF COMPLAINT Patient presents for Retina Follow Up   HISTORY OF PRESENT ILLNESS: Isabella Morales is a 52 y.o. female who presents to the clinic today for:   HPI     Retina Follow Up   Patient presents with  Retinal Break/Detachment.  In right eye.  Severity is moderate.  Since onset it is stable.  I, the attending physician,  performed the HPI with the patient and updated documentation appropriately.        Comments   Pt here for 1 wk ret f/u RD OD/ lattice OS. Pt states no change or improvement in [place]. Drop regimen confirmed as PF QID OD, Atropine BID OD, Brim BID OD, PSO ung QID OD.       Last edited by Bernarda Caffey, MD on 08/29/2021  2:26 AM.     Pt states she is maintaining face down position most of the time, using drops as directed  Referring physician: Vernie Shanks, MD No address on file  HISTORICAL INFORMATION:   Selected notes from the MEDICAL RECORD NUMBER Referred by [facility] for mac off RD LEE:  Ocular Hx- PMH-    CURRENT MEDICATIONS: Current Outpatient Medications (Ophthalmic Drugs)  Medication Sig   bacitracin-polymyxin b (POLYSPORIN) ophthalmic ointment Place into the right eye 4 (four) times daily. Place a 1/2 inch ribbon of ointment into the lower eyelid.   brimonidine (ALPHAGAN) 0.2 % ophthalmic solution Place 1 drop into the right eye 2 (two) times daily.   prednisoLONE acetate (PRED FORTE) 1 % ophthalmic suspension Place 1 drop into the right eye 4 (four) times daily.   No current facility-administered medications for this visit. (Ophthalmic Drugs)   Current Outpatient Medications (Other)  Medication Sig   CALCIUM PO Take 2 tablets by mouth daily. Gummie   HYDROcodone-acetaminophen (NORCO/VICODIN) 5-325 MG tablet Take 1 tablet by mouth every 4 (four) hours as needed for moderate pain.   No current facility-administered medications for this visit. (Other)   REVIEW OF  SYSTEMS: ROS   Positive for: Eyes Negative for: Constitutional, Gastrointestinal, Neurological, Skin, Genitourinary, Musculoskeletal, HENT, Endocrine, Cardiovascular, Respiratory, Psychiatric, Allergic/Imm, Heme/Lymph Last edited by Kingsley Spittle, COT on 08/27/2021  8:18 AM.     ALLERGIES No Known Allergies  PAST MEDICAL HISTORY Past Medical History:  Diagnosis Date   Medical history non-contributory    Past Surgical History:  Procedure Laterality Date   EYE EXAMINATION UNDER ANESTHESIA Left 08/13/2021   Procedure: LEFT EYE EXAM UNDER ANESTHESIA WITH LASER INDIRECT OPHTHALMOSCOPY;  Surgeon: Bernarda Caffey, MD;  Location: [place];  Service: Ophthalmology;  Laterality: Left;   GAS INSERTION Right 08/13/2021   Procedure: INSERTION OF GAS;  Surgeon: Bernarda Caffey, MD;  Location: [place];  Service: Ophthalmology;  Laterality: Right;   GAS/FLUID EXCHANGE Right 08/13/2021   Procedure: GAS/FLUID EXCHANGE;  Surgeon: Bernarda Caffey, MD;  Location: [place];  Service: Ophthalmology;  Laterality: Right;   LASER PHOTO ABLATION Right 08/13/2021   Procedure: LASER PHOTO ABLATION;  Surgeon: Bernarda Caffey, MD;  Location: [place];  Service: Ophthalmology;  Laterality: Right;   PERFLUORONE INJECTION Right 08/13/2021   Procedure: PERFLUORONE INJECTION;  Surgeon: Bernarda Caffey, MD;  Location: [place];  Service: Ophthalmology;  Laterality: Right;   SCLERAL BUCKLE Right 08/13/2021   Procedure: SCLERAL BUCKLE PROCEDURE IN RIGHT EYE;  Surgeon: Bernarda Caffey, MD;  Location: [place];  Service: Ophthalmology;  Laterality: Right;   VITRECTOMY  [place] BUCKLE Right 08/13/2021   Procedure: VITRECTOMY 25 GAUGE;  Surgeon: Bernarda Caffey, MD;  Location: [place];  Service: Ophthalmology;  Laterality: Right;   FAMILY HISTORY Family History  Problem Relation Age of Onset   Diabetes Mother    Heart disease Father    SOCIAL HISTORY Social History   Tobacco Use   Smoking status: Never   Smokeless tobacco: Never   Substance Use Topics   Alcohol use: Yes   Drug use: Not Currently       OPHTHALMIC EXAM:  Base Eye Exam     Visual Acuity (Snellen - Linear)       Right Left   Dist [place] CF at 6in 20/40 +1   Dist ph [place] NI 20/25         Tonometry (Tonopen, 8:25 AM)       Right Left   Pressure 8 16         Pupils       Dark Light Shape React APD   Right Dilated Dilated Round     Left 3 2 Round Brisk None         Visual Fields (Counting fingers)       Left Right    Full    Restrictions  Total superior temporal, inferior temporal deficiencies         Extraocular Movement       Right Left    Full, Ortho Full, Ortho         Neuro/Psych     Oriented x3: Yes   Mood/Affect: Normal         Dilation     Both eyes: 1.0% Mydriacyl, 2.5% Phenylephrine @ 8:26 AM           Slit Lamp and Fundus Exam     Slit Lamp Exam       Right Left   Lids/Lashes Normal Dermatochalasis - upper lid   Conjunctiva/Sclera Subconjunctival hemorrhage 360 - improving, sutures intact White and quiet   Cornea epi defect - closed, endo mild heme inferiorly, 3-4+ Punctate epithelial erosions, pigmented KP centrally tear film debris   Anterior Chamber Deep, 0.5+cell deep, clear   Iris irregular dilation Round and dilated   Lens 2+ Nuclear sclerosis, 2+ Cortical cataract, 2-3+ PCS, posterior capsulotomy 2+ Nuclear sclerosis, 2+ Cortical cataract   Anterior Vitreous post vitrectomy, good gas fill Vitreous syneresis         Fundus Exam       Right Left   Disc hazy view, perfused Pink and Sharp   C/D Ratio 0.1 0.1   Macula hazy view, flat under gas Flat, Good foveal reflex, No heme or edema   Vessels Tortuous, mild AV crossing changes attenuated, Tortuous   Periphery Hazy view, good buckle height, good early laser changes over buckle, ORIGINALLY: bullous RD from 5638-9373 with retinal tears at 1030, 1100, 1200 and 0700 Attached, pigment lattice from 0500-0600 with retinal holes at 0500  and 0600 with shallow SRF, focal lattice at 0730, pigmented lattice at 0100 and 0130 - no SRF; VR tufts at 1030 and 11; all lesion with good laser changes surrounding, no new RT/RD           IMAGING AND PROCEDURES  Imaging and Procedures for 08/27/2021          ASSESSMENT/PLAN:    ICD-10-CM   1. 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 Retinal hole of both eyes  H33.323     4. 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, onset of foveal involvement 6.14.23 by pt history -- maybe earlier - detached temporally from 0600-1230 oclock, fovea off, +corrugations - 4 tears within detachment -- 0730, 1030, 1100, and 1200; +lattice degen within detachment also - POW2 s/p SBP + PPV/PFO/EL/FAX/14% C3F8 OD, 06.29.23             - doing well              - retina attached and in good position under gas-- good buckle height and laser around breaks             - IOP improved to 11  - fibrin rxn improved; mild pigmented KP remains             - cont   PF QID OD                         Atropine BID OD -- okay to stop                         Brimonidine BID OD -- okay to stop                         PSO ung QID OD -- decrease to qhs / bedtime  - add AT's QID             - cont face down positioning 30 min/hr; avoid laying flat on back              - can d/c eye shield when sleeping             - post op drop and positioning instructions reviewed              - tylenol/ibuprofen for pain              - Rx given for breakthrough pain  - f/u 2 weeks, DFE OU  2,3. Lattice degeneration w/ atrophic holes, left eye - pigment lattice from 0500-0600 with retinal holes at 0500 and 0600 with shallow SRF; focal lattice at 0730, pigmented lattice from 0100 and 0130 - no SRF - s/p laser retinopexy OS (06.19.23) - s/p touch up laser in OR (06.29.23) --  good laser changes around 1030 and 1100 VR tufts - monitor  4. Mixed Cataract OU - The symptoms of cataract, surgical options, and treatments and risks were discussed with patient. - discussed diagnosis and progression -- specifically post vitrectomy progression - monitor  Ophthalmic Meds Ordered this visit:  No orders of the defined types were placed in this encounter.    Return in about 2 weeks (around 09/10/2021) for f/u RD OD, DFE, OCT.  There are no Patient Instructions on file for this visit.   Explained the diagnoses, plan, and follow up with the patient and they expressed understanding.  Patient expressed understanding of the importance of proper follow up care.   This document serves as a record of services personally performed by Gardiner Sleeper, MD, PhD. It was created on their behalf by San Jetty. Owens Shark, OA an ophthalmic technician. The creation of this record is the provider's dictation and/or activities during the visit.    Electronically signed  by: San Jetty Owens Shark, [place] 07.13.2023 2:26 AM   Gardiner Sleeper, M.D., Ph.D. Diseases & Surgery of the Retina and Vitreous [facility]  I have reviewed the above documentation for accuracy and completeness, and I agree with the above. Gardiner Sleeper, M.D., Ph.D. 08/29/21 2:30 AM   Abbreviations: M myopia (nearsighted); A astigmatism; H hyperopia (farsighted); P presbyopia; Mrx spectacle prescription;  CTL contact lenses; OD right eye; OS left eye; OU both eyes  XT exotropia; ET esotropia; PEK punctate epithelial keratitis; PEE punctate epithelial erosions; DES dry eye syndrome; MGD meibomian gland dysfunction; ATs artificial tears; PFAT's preservative free artificial tears; [place] nuclear sclerotic cataract; PSC posterior subcapsular cataract; ERM epi-retinal membrane; PVD posterior vitreous detachment; RD retinal detachment; DM diabetes mellitus; DR diabetic retinopathy; NPDR non-proliferative diabetic  retinopathy; PDR proliferative diabetic retinopathy; CSME clinically significant macular edema; DME diabetic macular edema; dbh dot blot hemorrhages; CWS cotton wool spot; POAG primary open angle glaucoma; C/D cup-to-disc ratio; HVF humphrey visual field; GVF goldmann visual field; OCT optical coherence tomography; IOP intraocular pressure; BRVO Branch retinal vein occlusion; CRVO central retinal vein occlusion; CRAO central retinal artery occlusion; BRAO branch retinal artery occlusion; RT retinal tear; SB scleral buckle; PPV pars plana vitrectomy; VH Vitreous hemorrhage; PRP panretinal laser photocoagulation; IVK intravitreal kenalog; VMT vitreomacular traction; MH Macular hole;  NVD neovascularization of the disc; NVE neovascularization elsewhere; AREDS age related eye disease study; ARMD age related macular degeneration; POAG primary open angle glaucoma; EBMD epithelial/anterior basement membrane dystrophy; ACIOL anterior chamber intraocular lens; IOL intraocular lens; PCIOL posterior chamber intraocular lens; Phaco/IOL phacoemulsification with intraocular lens placement; [place] photorefractive keratectomy; LASIK laser assisted in situ keratomileusis; HTN hypertension; DM diabetes mellitus; COPD chronic obstructive pulmonary disease

## 2021-08-29 ENCOUNTER — Encounter (INDEPENDENT_AMBULATORY_CARE_PROVIDER_SITE_OTHER): Payer: Self-pay | Admitting: Ophthalmology

## 2021-08-31 NOTE — Progress Notes (Signed)
[facility] Note  09/10/2021     CHIEF COMPLAINT Patient presents for Retina Follow Up   HISTORY OF PRESENT ILLNESS: Isabella Morales is a 51 y.o. female who presents to the clinic today for:   HPI     Retina Follow Up   Patient presents with  Retinal Break/Detachment (SB/PPV/PFO/EL/FAX/C3F8 OD 06.29.23).  In right eye.  Severity is moderate.  Since onset it is stable.  I, the attending physician,  performed the HPI with the patient and updated documentation appropriately.        Comments   Patient sees that the gas bubble is getting smaller. The vision is foggy.       Last edited by Annie Paras, COT on 09/10/2021  1:31 PM.    Pt states bubble is 3/4 down in [place] OD. [facility] returning slowly.   Referring physician: No referring provider defined for this encounter.  HISTORICAL INFORMATION:   Selected notes from the MEDICAL RECORD NUMBER Referred by [facility] for mac off RD LEE:  Ocular Hx- PMH-    CURRENT MEDICATIONS: Current Outpatient Medications (Ophthalmic Drugs)  Medication Sig   bacitracin-polymyxin b (POLYSPORIN) ophthalmic ointment Place into the right eye 4 (four) times daily. Place a 1/2 inch ribbon of ointment into the lower eyelid.   brimonidine (ALPHAGAN) 0.2 % ophthalmic solution Place 1 drop into the right eye 2 (two) times daily.   prednisoLONE acetate (PRED FORTE) 1 % ophthalmic suspension Place 1 drop into the right eye 4 (four) times daily.   No current facility-administered medications for this visit. (Ophthalmic Drugs)   Current Outpatient Medications (Other)  Medication Sig   CALCIUM PO Take 2 tablets by mouth daily. Gummie   HYDROcodone-acetaminophen (NORCO/VICODIN) 5-325 MG tablet Take 1 tablet by mouth every 4 (four) hours as needed for moderate pain.   No current facility-administered medications for this visit. (Other)   REVIEW OF SYSTEMS: ROS   Positive for: Eyes Negative for: Constitutional,  Gastrointestinal, Neurological, Skin, Genitourinary, Musculoskeletal, HENT, Endocrine, Cardiovascular, Respiratory, Psychiatric, Allergic/Imm, Heme/Lymph Last edited by Annie Paras, COT on 09/10/2021  1:31 PM.      ALLERGIES No Known Allergies  PAST MEDICAL HISTORY Past Medical History:  Diagnosis Date   Medical history non-contributory    Past Surgical History:  Procedure Laterality Date   EYE EXAMINATION UNDER ANESTHESIA Left 08/13/2021   Procedure: LEFT EYE EXAM UNDER ANESTHESIA WITH LASER INDIRECT OPHTHALMOSCOPY;  Surgeon: Bernarda Caffey, MD;  Location: [place];  Service: Ophthalmology;  Laterality: Left;   GAS INSERTION Right 08/13/2021   Procedure: INSERTION OF GAS;  Surgeon: Bernarda Caffey, MD;  Location: [place];  Service: Ophthalmology;  Laterality: Right;   GAS/FLUID EXCHANGE Right 08/13/2021   Procedure: GAS/FLUID EXCHANGE;  Surgeon: Bernarda Caffey, MD;  Location: [place];  Service: Ophthalmology;  Laterality: Right;   LASER PHOTO ABLATION Right 08/13/2021   Procedure: LASER PHOTO ABLATION;  Surgeon: Bernarda Caffey, MD;  Location: [place];  Service: Ophthalmology;  Laterality: Right;   PERFLUORONE INJECTION Right 08/13/2021   Procedure: PERFLUORONE INJECTION;  Surgeon: Bernarda Caffey, MD;  Location: [place];  Service: Ophthalmology;  Laterality: Right;   SCLERAL BUCKLE Right 08/13/2021   Procedure: SCLERAL BUCKLE PROCEDURE IN RIGHT EYE;  Surgeon: Bernarda Caffey, MD;  Location: [place];  Service: Ophthalmology;  Laterality: Right;   VITRECTOMY 25 GAUGE WITH SCLERAL BUCKLE Right 08/13/2021   Procedure: VITRECTOMY 25 GAUGE;  Surgeon: Bernarda Caffey, MD;  Location: [place];  Service: Ophthalmology;  Laterality: Right;   FAMILY HISTORY Family History  Problem Relation Age of Onset   Diabetes Mother    Heart disease Father    SOCIAL HISTORY Social History   Tobacco Use   Smoking status: Never   Smokeless tobacco: Never  Substance Use Topics   Alcohol use: Yes   Drug use: Not Currently        OPHTHALMIC EXAM:  Base Eye Exam     Visual Acuity (Snellen - Linear)       Right Left   Dist [place] 20/400 20/20   Dist ph [place] 20/100 +1          Tonometry (Tonopen, 1:34 PM)       Right Left   Pressure 19 16         Pupils       Dark Light Shape React APD   Right 3 2 Round Minimal None   Left 3 2 Round Brisk None         Visual Fields       Left Right    Full    Restrictions  Total superior temporal, inferior temporal deficiencies         Extraocular Movement       Right Left    Full, Ortho Full, Ortho         Neuro/Psych     Oriented x3: Yes   Mood/Affect: Normal         Dilation     Both eyes: 2.5% Phenylephrine, 1.0% Mydriacyl @ 1:32 PM           Slit Lamp and Fundus Exam     Slit Lamp Exam       Right Left   Lids/Lashes Normal Dermatochalasis - upper lid   Conjunctiva/Sclera White and quiet - improving, sutures intact White and quiet   Cornea 3+ Punctate epithelial erosions, pigmented KP inferior peracentral Clear   Anterior Chamber Deep, Clear deep, clear   Iris Round and dilated Round and dilated   Lens 2+ Nuclear sclerosis, 2-3+ Cortical cataract, 2-3+ PCS, posterior capsulotomy 2+ Nuclear sclerosis, 2+ Cortical cataract   Anterior Vitreous post vitrectomy, 50% gas bubble Vitreous syneresis         Fundus Exam       Right Left   Disc hazy view, perfused Pink and Sharp   C/D Ratio 0.1 0.1   Macula Attached, RPE mottling Flat, Good foveal reflex, No heme or edema   Vessels Tortuous, mild AV crossing changes attenuated, Tortuous   Periphery Attached, good buckle height, good laser changes over buckle, ORIGINALLY: bullous RD from 8921-1941 with retinal tears at 1030, 1100, 1200 and 0700 Attached, pigment lattice from 0500-0600 with retinal holes at 0500 and 0600 with shallow SRF, focal lattice at 0730, pigmented lattice at 0100 and 0130 - no SRF; VR tufts at 1030 and 11; all lesion with good laser changes surrounding, no  new RT/RD           IMAGING AND PROCEDURES  Imaging and Procedures for 09/10/2021  OCT, Retina - OU - Both Eyes       Right Eye Quality was borderline. Central Foveal Thickness: 216. Progression has improved. Findings include normal foveal contour, no IRF, no SRF, intraretinal fluid, subretinal fluid, outer retinal atrophy (Retina reattached, patchy ORF).   Left Eye Quality was good. Central Foveal Thickness: 314. Progression has been stable. Findings include normal foveal contour, no IRF, no SRF, vitreomacular adhesion .  Notes *Images captured and stored on drive  Diagnosis / Impression:  OD: Retina reattached, patchy ORA OS: NFP, no IRF/SRF  Clinical management:  See below  Abbreviations: NFP - Normal foveal profile. CME - cystoid macular edema. PED - pigment epithelial detachment. IRF - intraretinal fluid. SRF - subretinal fluid. EZ - ellipsoid zone. ERM - epiretinal membrane. ORA - outer retinal atrophy. ORT - outer retinal tubulation. SRHM - subretinal hyper-reflective material. IRHM - intraretinal hyper-reflective material            ASSESSMENT/PLAN:    ICD-10-CM   1. 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 Retinal hole of both eyes  H33.323     4. 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, onset of foveal involvement 6.14.23 by pt history -- maybe earlier - detached temporally from 0600-1230 oclock, fovea off, +corrugations - 4 tears within detachment -- 0730, 1030, 1100, and 1200; +lattice degen within detachment also - POW4 s/p SBP + PPV/PFO/EL/FAX/14% C3F8 OD, 06.29.23             - doing well              - retina attached and in good position under gas-- good buckle height and laser around  breaks             - IOP 19 today   - fibrin rxn improved; mild pigmented KP remains             - cont   PF QID QID-decrease to TID                         PSO ung qhs OD  - AT's QID-increase to 5-6x daily for excessive dryness OD             - cont face down positioning when able; avoid laying flat on back; clear for light exercise, no excess head movement during physical activity.              - can d/c eye shield when sleeping             - post op drop and positioning instructions reviewed              - tylenol/ibuprofen for pain              - Rx given for breakthrough pain  - f/u 3-4 weeks, DFE OU  2,3. Lattice degeneration w/ atrophic holes, left eye - pigment lattice from 0500-0600 with retinal holes at 0500 and 0600 with shallow SRF; focal lattice at 0730, pigmented lattice from 0100 and 0130 - no SRF - s/p laser retinopexy OS (06.19.23) - s/p touch up laser in OR (06.29.23) -- good laser changes around 1030 and 1100 VR tufts - monitor  4. Mixed Cataract OU - The symptoms of cataract, surgical options, and treatments and risks were discussed with patient. - discussed diagnosis and progression -- specifically post vitrectomy progression - monitor  Ophthalmic Meds Ordered this visit:  No orders of the defined types were placed in this encounter.    Return for 3-4 weeks, RD OD DFE, OCT .  There are no Patient Instructions on file for this visit.   Explained the diagnoses, plan, and follow up with the patient and they expressed understanding.  Patient expressed understanding of the importance of proper follow up care.   This document serves as a record of services personally performed by Gardiner Sleeper, MD, PhD. It was created on their behalf by San Jetty. Owens Shark, OA an ophthalmic technician. The creation of this record is the provider's dictation and/or activities during the visit.    Electronically signed by: San Jetty. Owens Shark, [place] 07.17.2023 3:10 PM  This document serves  as a record of services personally performed by Gardiner Sleeper, MD, PhD. It was created on their behalf by Orvan Falconer, an ophthalmic technician. The creation of this record is the provider's dictation and/or activities during the visit.    Electronically signed by: Orvan Falconer, OA, 09/11/21  3:10 PM  Gardiner Sleeper, M.D., Ph.D. Diseases & Surgery of the Retina and Vitreous [facility]  I have reviewed the above documentation for accuracy and completeness, and I agree with the above. Gardiner Sleeper, M.D., Ph.D. 09/11/21 3:10 PM  Abbreviations: M myopia (nearsighted); A astigmatism; H hyperopia (farsighted); P presbyopia; Mrx spectacle prescription;  CTL contact lenses; OD right eye; OS left eye; OU both eyes  XT exotropia; ET esotropia; PEK punctate epithelial keratitis; PEE punctate epithelial erosions; DES dry eye syndrome; MGD meibomian gland dysfunction; ATs artificial tears; PFAT's preservative free artificial tears; [place] nuclear sclerotic cataract; PSC posterior subcapsular cataract; ERM epi-retinal membrane; PVD posterior vitreous detachment; RD retinal detachment; DM diabetes mellitus; DR diabetic retinopathy; NPDR non-proliferative diabetic retinopathy; PDR proliferative diabetic retinopathy; CSME clinically significant macular edema; DME diabetic macular edema; dbh dot blot hemorrhages; CWS cotton wool spot; POAG primary open angle glaucoma; C/D cup-to-disc ratio; HVF humphrey visual field; GVF goldmann visual field; OCT optical coherence tomography; IOP intraocular pressure; BRVO Branch retinal vein occlusion; CRVO central retinal vein occlusion; CRAO central retinal artery occlusion; BRAO branch retinal artery occlusion; RT retinal tear; SB scleral buckle; PPV pars plana vitrectomy; VH Vitreous hemorrhage; PRP panretinal laser photocoagulation; IVK intravitreal kenalog; VMT vitreomacular traction; MH Macular hole;  NVD neovascularization of the disc; NVE  neovascularization elsewhere; AREDS age related eye disease study; ARMD age related macular degeneration; POAG primary open angle glaucoma; EBMD epithelial/anterior basement membrane dystrophy; ACIOL anterior chamber intraocular lens; IOL intraocular lens; PCIOL posterior chamber intraocular lens; Phaco/IOL phacoemulsification with intraocular lens placement; [place] photorefractive keratectomy; LASIK laser assisted in situ keratomileusis; HTN hypertension; DM diabetes mellitus; COPD chronic obstructive pulmonary disease

## 2021-09-10 ENCOUNTER — Ambulatory Visit (INDEPENDENT_AMBULATORY_CARE_PROVIDER_SITE_OTHER): Payer: 59 | Admitting: Ophthalmology

## 2021-09-10 DIAGNOSIS — H25813 Combined forms of age-related cataract, bilateral: Secondary | ICD-10-CM

## 2021-09-10 DIAGNOSIS — H35412 Lattice degeneration of retina, left eye: Secondary | ICD-10-CM

## 2021-09-10 DIAGNOSIS — H3321 Serous retinal detachment, right eye: Secondary | ICD-10-CM

## 2021-09-10 DIAGNOSIS — H33323 Round hole, bilateral: Secondary | ICD-10-CM

## 2021-09-11 ENCOUNTER — Encounter (INDEPENDENT_AMBULATORY_CARE_PROVIDER_SITE_OTHER): Payer: Self-pay | Admitting: Ophthalmology

## 2021-09-30 NOTE — Progress Notes (Signed)
[facility] Note  10/07/2021     CHIEF COMPLAINT Patient presents for Retina Follow Up   HISTORY OF PRESENT ILLNESS: Isabella Morales is a 51 y.o. female who presents to the clinic today for:   HPI     Retina Follow Up   Patient presents with  Retinal Break/Detachment.  In right eye.  This started 4 weeks ago.  I, the attending physician,  performed the HPI with the patient and updated documentation appropriately.        Comments   Patient here for 4 weeks retina follow up for RD OD. Patient states vision getting a little better. Still a little cloudy. Has a dull pain brow area. When looks directly out can't see the bubble. When puts head down can see it.       Last edited by Bernarda Caffey, MD on 10/09/2021 12:30 AM.    Pt states she cannot see the gas bubble when she looks straight ahead, but she can when she looks down  Referring physician: Luberta Mutter, MD [place],  [place] [id number]  HISTORICAL INFORMATION:   Selected notes from the MEDICAL RECORD NUMBER Referred by [facility] for mac off RD LEE:  Ocular Hx- PMH-    CURRENT MEDICATIONS: Current Outpatient Medications (Ophthalmic Drugs)  Medication Sig   bacitracin-polymyxin b (POLYSPORIN) ophthalmic ointment Place into the right eye 4 (four) times daily. Place a 1/2 inch ribbon of ointment into the lower eyelid.   brimonidine (ALPHAGAN) 0.2 % ophthalmic solution Place 1 drop into the right eye 2 (two) times daily.   prednisoLONE acetate (PRED FORTE) 1 % ophthalmic suspension Place 1 drop into the right eye 4 (four) times daily.   No current facility-administered medications for this visit. (Ophthalmic Drugs)   Current Outpatient Medications (Other)  Medication Sig   CALCIUM PO Take 2 tablets by mouth daily. Gummie   HYDROcodone-acetaminophen (NORCO/VICODIN) 5-325 MG tablet Take 1 tablet by mouth every 4 (four) hours as needed for moderate pain.   No current  facility-administered medications for this visit. (Other)   REVIEW OF SYSTEMS: ROS   Positive for: Eyes Negative for: Constitutional, Gastrointestinal, Neurological, Skin, Genitourinary, Musculoskeletal, HENT, Endocrine, Cardiovascular, Respiratory, Psychiatric, Allergic/Imm, Heme/Lymph Last edited by Theodore Demark, COA on 10/07/2021  2:14 PM.     ALLERGIES No Known Allergies  PAST MEDICAL HISTORY Past Medical History:  Diagnosis Date   Medical history non-contributory    Past Surgical History:  Procedure Laterality Date   EYE EXAMINATION UNDER ANESTHESIA Left 08/13/2021   Procedure: LEFT EYE EXAM UNDER ANESTHESIA WITH LASER INDIRECT OPHTHALMOSCOPY;  Surgeon: Bernarda Caffey, MD;  Location: [place];  Service: Ophthalmology;  Laterality: Left;   GAS INSERTION Right 08/13/2021   Procedure: INSERTION OF GAS;  Surgeon: Bernarda Caffey, MD;  Location: [place];  Service: Ophthalmology;  Laterality: Right;   GAS/FLUID EXCHANGE Right 08/13/2021   Procedure: GAS/FLUID EXCHANGE;  Surgeon: Bernarda Caffey, MD;  Location: [place];  Service: Ophthalmology;  Laterality: Right;   LASER PHOTO ABLATION Right 08/13/2021   Procedure: LASER PHOTO ABLATION;  Surgeon: Bernarda Caffey, MD;  Location: [place];  Service: Ophthalmology;  Laterality: Right;   PERFLUORONE INJECTION Right 08/13/2021   Procedure: PERFLUORONE INJECTION;  Surgeon: Bernarda Caffey, MD;  Location: [place];  Service: Ophthalmology;  Laterality: Right;   SCLERAL BUCKLE Right 08/13/2021   Procedure: SCLERAL BUCKLE PROCEDURE IN RIGHT EYE;  Surgeon: Bernarda Caffey, MD;  Location: [place];  Service: Ophthalmology;  Laterality: Right;   VITRECTOMY 25 GAUGE WITH SCLERAL BUCKLE Right 08/13/2021   Procedure: VITRECTOMY 25 GAUGE;  Surgeon: Bernarda Caffey, MD;  Location: [place];  Service: Ophthalmology;  Laterality: Right;   FAMILY HISTORY Family History  Problem Relation Age of Onset   Diabetes Mother    Heart disease Father    SOCIAL HISTORY Social History    Tobacco Use   Smoking status: Never   Smokeless tobacco: Never  Vaping Use   Vaping Use: Never used  Substance Use Topics   Alcohol use: Yes   Drug use: Not Currently       OPHTHALMIC EXAM:  Base Eye Exam     Visual Acuity (Snellen - Linear)       Right Left   Dist [place] 20/250 -2 20/20   Dist ph [place] 20/100          Tonometry (Tonopen, 2:10 PM)       Right Left   Pressure 18 22         Pupils       Dark Light Shape React APD   Right 3 2 Round Minimal None   Left 3 2 Round Brisk None         Visual Fields (Counting fingers)       Left Right    Full    Restrictions  Total inferior temporal, inferior nasal deficiencies         Extraocular Movement       Right Left    Full, Ortho Full, Ortho         Neuro/Psych     Oriented x3: Yes   Mood/Affect: Normal         Dilation     Both eyes: 1.0% Mydriacyl, 2.5% Phenylephrine @ 2:10 PM           Slit Lamp and Fundus Exam     Slit Lamp Exam       Right Left   Lids/Lashes Normal Dermatochalasis - upper lid   Conjunctiva/Sclera White and quiet - improving, sutures intact White and quiet   Cornea 2+ Punctate epithelial erosions Clear   Anterior Chamber deep and clear, no cell or flare deep, clear   Iris Round and dilated Round and dilated   Lens 2+ Nuclear sclerosis, 2-3+ Cortical cataract, 2-3+ PCS, posterior capsulotomy 2+ Nuclear sclerosis, 2+ Cortical cataract   Anterior Vitreous post vitrectomy, 1% gas bubble Vitreous syneresis         Fundus Exam       Right Left   Disc Pink and Sharp Pink and Sharp   C/D Ratio 0.1 0.1   Macula Flat, Good foveal reflex, No heme or edema Flat, Good foveal reflex, No heme or edema   Vessels Tortuous, mild attenuation, mild tortuosity, Telangiectasia mild attenuation, mild tortuosity   Periphery Retina attached over buckle, good buckle height, good laser changes over buckle, ORIGINALLY: bullous RD from 6720-9470 with retinal tears at 1030, 1100,  1200 and 0700 Attached, pigment lattice from 0500-0600 with retinal holes at 0500 and 0600 with shallow SRF, focal lattice at 0730, pigmented lattice at 0100 and 0130 - no SRF; VR tufts at 1030 and 11; all lesion with good laser changes surrounding, no new RT/RD           IMAGING AND PROCEDURES  Imaging and Procedures for 10/07/2021  OCT, Retina - OU - Both Eyes       Right Eye Quality was poor. Central Foveal  Thickness: 228. Progression has improved. Findings include normal foveal contour, no IRF, subretinal fluid, outer retinal atrophy (Retina reattached, patchy ORA, shallow pocket of SRF superior periphery caught on widefield).   Left Eye Quality was good. Central Foveal Thickness: 308. Progression has been stable. Findings include normal foveal contour, no IRF, no SRF, vitreomacular adhesion .   Notes *Images captured and stored on drive  Diagnosis / Impression:  OD: Retina reattached, patchy ORA, tr shallow pocket of SRF superior periphery caught on widefield OS: NFP, no IRF/SRF  Clinical management:  See below  Abbreviations: NFP - Normal foveal profile. CME - cystoid macular edema. PED - pigment epithelial detachment. IRF - intraretinal fluid. SRF - subretinal fluid. EZ - ellipsoid zone. ERM - epiretinal membrane. ORA - outer retinal atrophy. ORT - outer retinal tubulation. SRHM - subretinal hyper-reflective material. IRHM - intraretinal hyper-reflective material            ASSESSMENT/PLAN:    ICD-10-CM   1. 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 Retinal hole of both eyes  H33.323     4. 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, onset of foveal involvement 6.14.23 by pt history -- maybe earlier - detached temporally from 0600-1230 oclock, fovea off,  +corrugations - 4 tears within detachment -- 0730, 1030, 1100, and 1200; +lattice degen within detachment also - POW8 s/p SBP + PPV/PFO/EL/FAX/14% C3F8 OD, 06.29.23             - doing well              - retina attached and in good position under gas-- good buckle height and laser around breaks             - IOP 18 today  - fibrin rxn improved             - start PF taper -- BID for 1 week, qdaily for 1 week, then stop                         PSO ung qhs OD  - AT's QID-increase to 5-6x daily for excessive dryness OD             - cont face down positioning when able; avoid laying flat on back; clear for light exercise, no excess head movement during physical activity.              - can d/c eye shield when sleeping             - post op drop and positioning instructions reviewed              - tylenol/ibuprofen for pain              - Rx given for breakthrough pain  - f/u 6 weeks, DFE OU  2,3. Lattice degeneration w/ atrophic holes, left eye - pigmented lattice from 0500-0600 with retinal holes at 0500 and 0600 with shallow SRF; focal lattice at 0730, pigmented lattice from 0100 and 0130 - no SRF - s/p laser retinopexy OS (06.19.23) - s/p touch up laser in OR (06.29.23) -- good laser changes around 1030 and 1100 VR tufts - monitor  4. Mixed  Cataract OU - The symptoms of cataract, surgical options, and treatments and risks were discussed with patient. - discussed diagnosis and progression -- specifically post vitrectomy progression - of note, pt has a small central iatrogenic posterior capsulotomy from vitrectomy - will refer to Dr. Ellie Lunch for cataract consult  Ophthalmic Meds Ordered this visit:  No orders of the defined types were placed in this encounter.    Return in about 6 weeks (around 11/18/2021) for f/u RD OD, DFE, OCT.  There are no Patient Instructions on file for this visit.  Explained the diagnoses, plan, and follow up with the patient and they expressed  understanding.  Patient expressed understanding of the importance of proper follow up care.   This document serves as a record of services personally performed by Gardiner Sleeper, MD, PhD. It was created on their behalf by Orvan Falconer, an ophthalmic technician. The creation of this record is the provider's dictation and/or activities during the visit.    Electronically signed by: Orvan Falconer, OA, 10/09/21  12:32 AM  This document serves as a record of services personally performed by Gardiner Sleeper, MD, PhD. It was created on their behalf by San Jetty. Owens Shark, OA an ophthalmic technician. The creation of this record is the provider's dictation and/or activities during the visit.    Electronically signed by: San Jetty. Owens Shark, [place] 08.23.2023 12:32 AM   Gardiner Sleeper, M.D., Ph.D. Diseases & Surgery of the Retina and Vitreous [facility]  I have reviewed the above documentation for accuracy and completeness, and I agree with the above. Gardiner Sleeper, M.D., Ph.D. 10/09/21 12:37 AM   Abbreviations: M myopia (nearsighted); A astigmatism; H hyperopia (farsighted); P presbyopia; Mrx spectacle prescription;  CTL contact lenses; OD right eye; OS left eye; OU both eyes  XT exotropia; ET esotropia; PEK punctate epithelial keratitis; PEE punctate epithelial erosions; DES dry eye syndrome; MGD meibomian gland dysfunction; ATs artificial tears; PFAT's preservative free artificial tears; [place] nuclear sclerotic cataract; PSC posterior subcapsular cataract; ERM epi-retinal membrane; PVD posterior vitreous detachment; RD retinal detachment; DM diabetes mellitus; DR diabetic retinopathy; NPDR non-proliferative diabetic retinopathy; PDR proliferative diabetic retinopathy; CSME clinically significant macular edema; DME diabetic macular edema; dbh dot blot hemorrhages; CWS cotton wool spot; POAG primary open angle glaucoma; C/D cup-to-disc ratio; HVF humphrey visual field; GVF goldmann  visual field; OCT optical coherence tomography; IOP intraocular pressure; BRVO Branch retinal vein occlusion; CRVO central retinal vein occlusion; CRAO central retinal artery occlusion; BRAO branch retinal artery occlusion; RT retinal tear; SB scleral buckle; PPV pars plana vitrectomy; VH Vitreous hemorrhage; PRP panretinal laser photocoagulation; IVK intravitreal kenalog; VMT vitreomacular traction; MH Macular hole;  NVD neovascularization of the disc; NVE neovascularization elsewhere; AREDS age related eye disease study; ARMD age related macular degeneration; POAG primary open angle glaucoma; EBMD epithelial/anterior basement membrane dystrophy; ACIOL anterior chamber intraocular lens; IOL intraocular lens; PCIOL posterior chamber intraocular lens; Phaco/IOL phacoemulsification with intraocular lens placement; [place] photorefractive keratectomy; LASIK laser assisted in situ keratomileusis; HTN hypertension; DM diabetes mellitus; COPD chronic obstructive pulmonary disease

## 2021-10-07 ENCOUNTER — Encounter (INDEPENDENT_AMBULATORY_CARE_PROVIDER_SITE_OTHER): Payer: Self-pay | Admitting: Ophthalmology

## 2021-10-07 ENCOUNTER — Ambulatory Visit (INDEPENDENT_AMBULATORY_CARE_PROVIDER_SITE_OTHER): Payer: 59 | Admitting: Ophthalmology

## 2021-10-07 DIAGNOSIS — H33323 Round hole, bilateral: Secondary | ICD-10-CM

## 2021-10-07 DIAGNOSIS — H3321 Serous retinal detachment, right eye: Secondary | ICD-10-CM | POA: Diagnosis not present

## 2021-10-07 DIAGNOSIS — H25813 Combined forms of age-related cataract, bilateral: Secondary | ICD-10-CM

## 2021-10-07 DIAGNOSIS — H35412 Lattice degeneration of retina, left eye: Secondary | ICD-10-CM

## 2021-10-09 ENCOUNTER — Encounter (INDEPENDENT_AMBULATORY_CARE_PROVIDER_SITE_OTHER): Payer: Self-pay | Admitting: Ophthalmology

## 2021-11-10 NOTE — Progress Notes (Signed)
[facility] Note  11/24/2021     CHIEF COMPLAINT Patient presents for Retina Follow Up   HISTORY OF PRESENT ILLNESS: Isabella Morales is a 51 y.o. female who presents to the clinic today for:   HPI     Retina Follow Up   Patient presents with  Retinal Break/Detachment.  In right eye.  Severity is moderate.  Duration of 6 weeks.  Since onset it is stable.  I, the attending physician,  performed the HPI with the patient and updated documentation appropriately.        Comments   Pt here for 6 wk ret f/u RD OD. Pt states VA is the same, no changes or issues.       Last edited by Bernarda Caffey, MD on 11/27/2021 12:23 AM.    Pt states she saw Dr. Valma Cava at [facility] on October 4th, she is waiting a call back to get on her schedule for cataract sx, pt is using AT's every 2-3 hours  Referring physician: No referring provider defined for this encounter.  HISTORICAL INFORMATION:   Selected notes from the MEDICAL RECORD NUMBER Referred by [facility] for mac off RD LEE:  Ocular Hx- PMH-    CURRENT MEDICATIONS: Current Outpatient Medications (Ophthalmic Drugs)  Medication Sig   bacitracin-polymyxin b (POLYSPORIN) ophthalmic ointment Place into the right eye 4 (four) times daily. Place a 1/2 inch ribbon of ointment into the lower eyelid. (Patient not taking: Reported on 11/24/2021)   brimonidine (ALPHAGAN) 0.2 % ophthalmic solution Place 1 drop into the right eye 2 (two) times daily. (Patient not taking: Reported on 11/24/2021)   prednisoLONE acetate (PRED FORTE) 1 % ophthalmic suspension Place 1 drop into the right eye 4 (four) times daily. (Patient not taking: Reported on 11/24/2021)   No current facility-administered medications for this visit. (Ophthalmic Drugs)   Current Outpatient Medications (Other)  Medication Sig   CALCIUM PO Take 2 tablets by mouth daily. Gummie   HYDROcodone-acetaminophen (NORCO/VICODIN) 5-325 MG tablet Take 1 tablet by  mouth every 4 (four) hours as needed for moderate pain. (Patient not taking: Reported on 11/24/2021)   No current facility-administered medications for this visit. (Other)   REVIEW OF SYSTEMS: ROS   Positive for: Eyes Negative for: Constitutional, Gastrointestinal, Neurological, Skin, Genitourinary, Musculoskeletal, HENT, Endocrine, Cardiovascular, Respiratory, Psychiatric, Allergic/Imm, Heme/Lymph Last edited by Kingsley Spittle, COT on 11/24/2021  8:34 AM.     ALLERGIES No Known Allergies  PAST MEDICAL HISTORY Past Medical History:  Diagnosis Date   Medical history non-contributory    Past Surgical History:  Procedure Laterality Date   EYE EXAMINATION UNDER ANESTHESIA Left 08/13/2021   Procedure: LEFT EYE EXAM UNDER ANESTHESIA WITH LASER INDIRECT OPHTHALMOSCOPY;  Surgeon: Bernarda Caffey, MD;  Location: [place];  Service: Ophthalmology;  Laterality: Left;   GAS INSERTION Right 08/13/2021   Procedure: INSERTION OF GAS;  Surgeon: Bernarda Caffey, MD;  Location: [place];  Service: Ophthalmology;  Laterality: Right;   GAS/FLUID EXCHANGE Right 08/13/2021   Procedure: GAS/FLUID EXCHANGE;  Surgeon: Bernarda Caffey, MD;  Location: [place];  Service: Ophthalmology;  Laterality: Right;   LASER PHOTO ABLATION Right 08/13/2021   Procedure: LASER PHOTO ABLATION;  Surgeon: Bernarda Caffey, MD;  Location: [place];  Service: Ophthalmology;  Laterality: Right;   PERFLUORONE INJECTION Right 08/13/2021   Procedure: PERFLUORONE INJECTION;  Surgeon: Bernarda Caffey, MD;  Location: [place];  Service: Ophthalmology;  Laterality: Right;   SCLERAL BUCKLE Right 08/13/2021  Procedure: SCLERAL BUCKLE PROCEDURE IN RIGHT EYE;  Surgeon: Bernarda Caffey, MD;  Location: [place];  Service: Ophthalmology;  Laterality: Right;   VITRECTOMY 25 GAUGE WITH SCLERAL BUCKLE Right 08/13/2021   Procedure: VITRECTOMY 25 GAUGE;  Surgeon: Bernarda Caffey, MD;  Location: [place];  Service: Ophthalmology;  Laterality: Right;   FAMILY HISTORY Family History   Problem Relation Age of Onset   Diabetes Mother    Heart disease Father    SOCIAL HISTORY Social History   Tobacco Use   Smoking status: Never   Smokeless tobacco: Never  Vaping Use   Vaping Use: Never used  Substance Use Topics   Alcohol use: Yes   Drug use: Not Currently       OPHTHALMIC EXAM:  Base Eye Exam     Visual Acuity (Snellen - Linear)       Right Left   Dist [place] 20/150 -1 20/20 -1   Dist ph [place] 20/80 -1          Tonometry (Tonopen, 8:41 AM)       Right Left   Pressure 12 15         Pupils       Dark Light Shape React APD   Right 4 3 Round Brisk None   Left 3 2 Round Brisk None         Visual Fields (Counting fingers)       Left Right    Full Full         Extraocular Movement       Right Left    Full, Ortho Full, Ortho         Neuro/Psych     Oriented x3: Yes   Mood/Affect: Normal         Dilation     Both eyes: 1.0% Mydriacyl, 2.5% Phenylephrine @ 8:42 AM           Slit Lamp and Fundus Exam     Slit Lamp Exam       Right Left   Lids/Lashes Normal Dermatochalasis - upper lid   Conjunctiva/Sclera White and quiet, sutures intact White and quiet   Cornea 1-2+ Punctate epithelial erosions, Trace endo pigment Clear   Anterior Chamber deep and clear, no cell or flare deep, clear   Iris Round and dilated, focal atrophy at 1030 Round and dilated   Lens 3-4+ Nuclear sclerosis, 3+ Cortical cataract, 3-4+ PSC, central posterior capsulotomy 2+ Nuclear sclerosis, 2+ Cortical cataract   Anterior Vitreous post vitrectomy, gas bubble gone Vitreous syneresis         Fundus Exam       Right Left   Disc Pink and Sharp, Compact Pink and Sharp   C/D Ratio 0.1 0.1   Macula Hazy view, retina flat Flat, Good foveal reflex, No heme or edema   Vessels mild attenuation mild attenuation, mild tortuosity   Periphery Retina attached over buckle, good buckle height, good laser changes over buckle, ORIGINALLY: bullous RD from  0300-9233 with retinal tears at 1030, 1100, 1200 and 0700 Attached, pigment lattice from 0500-0600 with retinal holes at 0500 and 0600 with shallow SRF, focal lattice at 0730, pigmented lattice at 0100 and 0130 - no SRF; VR tufts at 1030 and 11; all lesion with good laser changes surrounding, no new RT/RD           IMAGING AND PROCEDURES  Imaging and Procedures for 11/24/2021  OCT, Retina - OU - Both Eyes  Right Eye Quality was poor. Progression has been stable. Findings include normal foveal contour, no IRF, no SRF, outer retinal atrophy (Poor image quality, retina reattached, patchy ORA -- improving).   Left Eye Quality was good. Central Foveal Thickness: 307. Progression has been stable. Findings include normal foveal contour, no IRF, no SRF, vitreomacular adhesion .   Notes *Images captured and stored on drive  Diagnosis / Impression:  OD: Poor image quality, retina reattached, patchy ORA -- improving OS: NFP, no IRF/SRF  Clinical management:  See below  Abbreviations: NFP - Normal foveal profile. CME - cystoid macular edema. PED - pigment epithelial detachment. IRF - intraretinal fluid. SRF - subretinal fluid. EZ - ellipsoid zone. ERM - epiretinal membrane. ORA - outer retinal atrophy. ORT - outer retinal tubulation. SRHM - subretinal hyper-reflective material. IRHM - intraretinal hyper-reflective material            ASSESSMENT/PLAN:    ICD-10-CM   1. 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 Retinal hole of both eyes  H33.323     4. 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, onset of foveal involvement 6.14.23 by pt history -- maybe earlier - detached temporally from 0600-1230 oclock, fovea off, +corrugations - 4 tears within detachment -- 0730, 1030, 1100, and 1200; +lattice  degen within detachment also - POM3 s/p SBP + PPV/PFO/EL/FAX/14% C3F8 OD, 06.29.23             - doing well, but visually significant post vit cataract             - retina attached and in good position under gas-- good buckle height and laser around breaks             - IOP 12 today             - completed all drops  - AT's QID-increase to 5-6x daily for excessive dryness OD  - f/u 2-3 months, DFE OU  2,3. Lattice degeneration w/ atrophic holes, left eye - pigmented lattice from 0500-0600 with retinal holes at 0500 and 0600 with shallow SRF; focal lattice at 0730, pigmented lattice from 0100 and 0130 - no SRF - s/p laser retinopexy OS (06.19.23) - s/p touch up laser in OR (06.29.23) -- good laser changes around 1030 and 1100 VR tufts - continue to monitor  4. Mixed Cataract OU - The symptoms of cataract, surgical options, and treatments and risks were discussed with patient. - discussed diagnosis and progression -- specifically post-vitrectomy progression - of note, pt has a small central iatrogenic posterior capsulotomy from vitrectomy - scheduled for CE/IOL with Dr. Valma Cava at [facility] on December 29, 2021  Ophthalmic Meds Ordered this visit:  No orders of the defined types were placed in this encounter.    Return for f/u 2-3 months, RD OD, DFE, OCT.  There are no Patient Instructions on file for this visit.  Explained the diagnoses, plan, and follow up with the patient and they expressed understanding.  Patient expressed understanding of the importance of proper follow up care.   This document serves as a record of services personally performed by Gardiner Sleeper, MD, PhD. It was created on their behalf by Renaldo Reel, [place] an ophthalmic technician. The creation of this record is the provider's  dictation and/or activities during the visit.    Electronically signed by:  Renaldo Reel, COT  9.26.23 12:24 AM  This document serves as a record of services personally performed  by Gardiner Sleeper, MD, PhD. It was created on their behalf by San Jetty. Owens Shark, OA an ophthalmic technician. The creation of this record is the provider's dictation and/or activities during the visit.    Electronically signed by: San Jetty. [place], [place] 10.10.2023 12:24 AM  Gardiner Sleeper, M.D., Ph.D. Diseases & Surgery of the Retina and Vitreous [facility]  I have reviewed the above documentation for accuracy and completeness, and I agree with the above. Gardiner Sleeper, M.D., Ph.D. 11/27/21 12:26 AM  Abbreviations: M myopia (nearsighted); A astigmatism; H hyperopia (farsighted); P presbyopia; Mrx spectacle prescription;  CTL contact lenses; OD right eye; OS left eye; OU both eyes  XT exotropia; ET esotropia; PEK punctate epithelial keratitis; PEE punctate epithelial erosions; DES dry eye syndrome; MGD meibomian gland dysfunction; ATs artificial tears; PFAT's preservative free artificial tears; [place] nuclear sclerotic cataract; PSC posterior subcapsular cataract; ERM epi-retinal membrane; PVD posterior vitreous detachment; RD retinal detachment; DM diabetes mellitus; DR diabetic retinopathy; NPDR non-proliferative diabetic retinopathy; PDR proliferative diabetic retinopathy; CSME clinically significant macular edema; DME diabetic macular edema; dbh dot blot hemorrhages; CWS cotton wool spot; POAG primary open angle glaucoma; C/D cup-to-disc ratio; HVF humphrey visual field; GVF goldmann visual field; OCT optical coherence tomography; IOP intraocular pressure; BRVO Branch retinal vein occlusion; CRVO central retinal vein occlusion; CRAO central retinal artery occlusion; BRAO branch retinal artery occlusion; RT retinal tear; SB scleral buckle; PPV pars plana vitrectomy; VH Vitreous hemorrhage; PRP panretinal laser photocoagulation; IVK intravitreal kenalog; VMT vitreomacular traction; MH Macular hole;  NVD neovascularization of the disc; NVE neovascularization elsewhere; AREDS age  related eye disease study; ARMD age related macular degeneration; POAG primary open angle glaucoma; EBMD epithelial/anterior basement membrane dystrophy; ACIOL anterior chamber intraocular lens; IOL intraocular lens; PCIOL posterior chamber intraocular lens; Phaco/IOL phacoemulsification with intraocular lens placement; [place] photorefractive keratectomy; LASIK laser assisted in situ keratomileusis; HTN hypertension; DM diabetes mellitus; COPD chronic obstructive pulmonary disease

## 2021-11-18 ENCOUNTER — Encounter (INDEPENDENT_AMBULATORY_CARE_PROVIDER_SITE_OTHER): Payer: 59 | Admitting: Ophthalmology

## 2021-11-24 ENCOUNTER — Ambulatory Visit (INDEPENDENT_AMBULATORY_CARE_PROVIDER_SITE_OTHER): Payer: 59 | Admitting: Ophthalmology

## 2021-11-24 ENCOUNTER — Encounter (INDEPENDENT_AMBULATORY_CARE_PROVIDER_SITE_OTHER): Payer: Self-pay | Admitting: Ophthalmology

## 2021-11-24 DIAGNOSIS — H25813 Combined forms of age-related cataract, bilateral: Secondary | ICD-10-CM | POA: Diagnosis not present

## 2021-11-24 DIAGNOSIS — H3321 Serous retinal detachment, right eye: Secondary | ICD-10-CM

## 2021-11-24 DIAGNOSIS — H35412 Lattice degeneration of retina, left eye: Secondary | ICD-10-CM

## 2021-11-24 DIAGNOSIS — H33323 Round hole, bilateral: Secondary | ICD-10-CM | POA: Diagnosis not present

## 2021-11-27 ENCOUNTER — Encounter (INDEPENDENT_AMBULATORY_CARE_PROVIDER_SITE_OTHER): Payer: Self-pay | Admitting: Ophthalmology

## 2022-01-25 ENCOUNTER — Encounter (INDEPENDENT_AMBULATORY_CARE_PROVIDER_SITE_OTHER): Payer: 59 | Admitting: Ophthalmology

## 2022-01-25 NOTE — Progress Notes (Signed)
[facility] Note  01/28/2022     CHIEF COMPLAINT Patient presents for Retina Follow Up   HISTORY OF PRESENT ILLNESS: Isabella Morales is a 51 y.o. female who presents to the clinic today for:   HPI     Retina Follow Up   Patient presents with  Retinal Break/Detachment.  In right eye.  Severity is moderate.  Duration of 2 months.  Since onset it is stable.  I, the attending physician,  performed the HPI with the patient and updated documentation appropriately.        Comments   Pt here for 2 mo ret f/u RD OD. Pt states VA in OD has improved since having cataract surgery in November, Dr. Valma Cava @ [place]. Pt did follow up w/ Dr. Valma Cava yesterday and was told there was some swelling in the retina. Pt was started on pred forte QID OD and ketorolac QID OD. Pt was also given nasal spray for dryness OU.       Last edited by Bernarda Caffey, MD on 01/28/2022  8:44 AM.    Pt had cataract sx with Dr. Valma Cava on 11.14.23, pt states vision has improved a little, not as much as she hoped, pt saw Dr. Valma Cava yesterday and was told she has swelling in her retina, she had been tapering off PF from sx, but Dr. Valma Cava told her to check with Dr. Coralyn Pear and re-start it, Dr. Valma Cava also wanted the pt to start ketorolac   Referring physician: No referring provider defined for this encounter.  HISTORICAL INFORMATION:   Selected notes from the MEDICAL RECORD NUMBER Referred by [facility] for mac off RD LEE:  Ocular Hx- PMH-    CURRENT MEDICATIONS: Current Outpatient Medications (Ophthalmic Drugs)  Medication Sig   Bromfenac Sodium (PROLENSA) 0.07 % SOLN Place 1 drop into the right eye 4 (four) times daily.   ketorolac (ACULAR) 0.5 % ophthalmic solution Place 1 drop into the right eye 4 (four) times daily.   prednisoLONE acetate (PRED FORTE) 1 % ophthalmic suspension Place 1 drop into the right eye 4 (four) times daily.   Varenicline Tartrate (TYRVAYA) 0.03 MG/ACT  SOLN Place into the nose.   bacitracin-polymyxin b (POLYSPORIN) ophthalmic ointment Place into the right eye 4 (four) times daily. Place a 1/2 inch ribbon of ointment into the lower eyelid. (Patient not taking: Reported on 11/24/2021)   brimonidine (ALPHAGAN) 0.2 % ophthalmic solution Place 1 drop into the right eye 2 (two) times daily. (Patient not taking: Reported on 11/24/2021)   No current facility-administered medications for this visit. (Ophthalmic Drugs)   Current Outpatient Medications (Other)  Medication Sig   CALCIUM PO Take 2 tablets by mouth daily. Gummie (Patient not taking: Reported on 01/28/2022)   HYDROcodone-acetaminophen (NORCO/VICODIN) 5-325 MG tablet Take 1 tablet by mouth every 4 (four) hours as needed for moderate pain. (Patient not taking: Reported on 11/24/2021)   No current facility-administered medications for this visit. (Other)   REVIEW OF SYSTEMS: ROS   Positive for: Eyes Negative for: Constitutional, Gastrointestinal, Neurological, Skin, Genitourinary, Musculoskeletal, HENT, Endocrine, Cardiovascular, Respiratory, Psychiatric, Allergic/Imm, Heme/Lymph Last edited by Kingsley Spittle, COT on 01/28/2022  7:52 AM.     ALLERGIES No Known Allergies  PAST MEDICAL HISTORY Past Medical History:  Diagnosis Date   Medical history non-contributory    Past Surgical History:  Procedure Laterality Date   CATARACT EXTRACTION Right    Dr. Valma Cava @ [place].   EYE EXAMINATION UNDER ANESTHESIA  Left 08/13/2021   Procedure: LEFT EYE EXAM UNDER ANESTHESIA WITH LASER INDIRECT OPHTHALMOSCOPY;  Surgeon: Bernarda Caffey, MD;  Location: [place];  Service: Ophthalmology;  Laterality: Left;   GAS INSERTION Right 08/13/2021   Procedure: INSERTION OF GAS;  Surgeon: Bernarda Caffey, MD;  Location: [place];  Service: Ophthalmology;  Laterality: Right;   GAS/FLUID EXCHANGE Right 08/13/2021   Procedure: GAS/FLUID EXCHANGE;  Surgeon: Bernarda Caffey, MD;  Location: [place];  Service:  Ophthalmology;  Laterality: Right;   LASER PHOTO ABLATION Right 08/13/2021   Procedure: LASER PHOTO ABLATION;  Surgeon: Bernarda Caffey, MD;  Location: [place];  Service: Ophthalmology;  Laterality: Right;   PERFLUORONE INJECTION Right 08/13/2021   Procedure: PERFLUORONE INJECTION;  Surgeon: Bernarda Caffey, MD;  Location: [place];  Service: Ophthalmology;  Laterality: Right;   SCLERAL BUCKLE Right 08/13/2021   Procedure: SCLERAL BUCKLE PROCEDURE IN RIGHT EYE;  Surgeon: Bernarda Caffey, MD;  Location: [place];  Service: Ophthalmology;  Laterality: Right;   VITRECTOMY 25 GAUGE WITH SCLERAL BUCKLE Right 08/13/2021   Procedure: VITRECTOMY 25 GAUGE;  Surgeon: Bernarda Caffey, MD;  Location: [place];  Service: Ophthalmology;  Laterality: Right;   FAMILY HISTORY Family History  Problem Relation Age of Onset   Diabetes Mother    Heart disease Father    SOCIAL HISTORY Social History   Tobacco Use   Smoking status: Never   Smokeless tobacco: Never  Vaping Use   Vaping Use: Never used  Substance Use Topics   Alcohol use: Yes   Drug use: Not Currently       OPHTHALMIC EXAM:  Base Eye Exam     Visual Acuity (Snellen - Linear)       Right Left   Dist [place] 20/100 +1 20/30 +1   Dist ph [place] 20/70 +1 20/20         Tonometry (Tonopen, 7:59 AM)       Right Left   Pressure 13 18         Pupils       Pupils Dark Light Shape React APD   Right PERRL 3 2 Round Brisk None   Left PERRL 3 2 Round Brisk None         Visual Fields (Counting fingers)       Left Right    Full Full         Extraocular Movement       Right Left    Full, Ortho Full, Ortho         Neuro/Psych     Oriented x3: Yes   Mood/Affect: Normal         Dilation     Both eyes: 1.0% Mydriacyl, 2.5% Phenylephrine @ 8:00 AM           Slit Lamp and Fundus Exam     Slit Lamp Exam       Right Left   Lids/Lashes Normal Dermatochalasis - upper lid   Conjunctiva/Sclera White and quiet White and quiet    Cornea 1+ Punctate epithelial erosions, Trace endo pigment, well healed cataract wound Clear   Anterior Chamber deep and clear, 1+cell/pigment deep, clear   Iris Round and dilated, focal atrophy at 1030 Round and dilated   Lens PC IOL in good position, 1+ Posterior capsular opacification; small PC opening 2+ Nuclear sclerosis, 2+ Cortical cataract   Anterior Vitreous post vitrectomy, trace pigment Vitreous syneresis         Fundus Exam       Right  Left   Disc Compact, mild Pallor, Sharp rim Pink and Sharp   C/D Ratio 0.1 0.1   Macula Flat, Blunted foveal reflex, mild ERM, trace cystic changes centrally Flat, Good foveal reflex, No heme or edema   Vessels mild attenuation, mild copper wiring mild attenuation, mild tortuosity   Periphery Retina attached over buckle, good buckle height, good laser changes over buckle, ORIGINALLY: bullous RD from 3710-6269 with retinal tears at 1030, 1100, 1200 and 0700 Attached, pigment lattice from 0500-0600 with retinal holes at 0500 and 0600 with shallow SRF, focal lattice at 0730, pigmented lattice at 0100 and 0130 - no SRF; VR tufts at 1030 and 11; all lesion with good laser changes surrounding, no new RT/RD           IMAGING AND PROCEDURES  Imaging and Procedures for 01/28/2022  OCT, Retina - OU - Both Eyes       Right Eye Quality was good. Central Foveal Thickness: 383. Progression has worsened. Findings include abnormal foveal contour, epiretinal membrane, intraretinal fluid, macular pucker, subretinal fluid, outer retinal atrophy (retina reattached; central cystic changes; patchy ORA -- improving, improving slivers of SRF superior periphery caught on widefield, mild ERM and pucker).   Left Eye Quality was good. Central Foveal Thickness: 306. Progression has been stable. Findings include normal foveal contour, no IRF, no SRF, vitreomacular adhesion .   Notes *Images captured and stored on drive  Diagnosis / Impression:  OD: retina  reattached, central cystic changes; patchy ORA -- improving, improving slivers of SRF superior periphery caught on widefield, mild ERM and pucker OS: NFP, no IRF/SRF  Clinical management:  See below  Abbreviations: NFP - Normal foveal profile. CME - cystoid macular edema. PED - pigment epithelial detachment. IRF - intraretinal fluid. SRF - subretinal fluid. EZ - ellipsoid zone. ERM - epiretinal membrane. ORA - outer retinal atrophy. ORT - outer retinal tubulation. SRHM - subretinal hyper-reflective material. IRHM - intraretinal hyper-reflective material            ASSESSMENT/PLAN:    ICD-10-CM   1. 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 Retinal hole of both eyes  H33.323     4. 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 Pseudophakia  Z96.1     6. Cystoid macular edema of right eye  H35.351      Rhegmatogenous retinal detachment, OD - bullous temporal mac off detachment, onset of foveal involvement 6.14.23 by pt history -- maybe earlier - detached temporally from 0600-1230 oclock, fovea off, +corrugations - 4 tears within detachment -- 0730, 1030, 1100, and 1200; +lattice degen within detachment also - s/p SBP + PPV/PFO/EL/FAX/14% C3F8 OD, 06.29.23             - doing well             - retina attached and in good position under gas-- good buckle height and laser around breaks             - IOP 13 today  - AT's prn  - f/u 3-4 wks months, DFE OU  2,3. Lattice degeneration w/ atrophic holes, left eye - pigmented lattice from 0500-0600 with retinal holes at 0500 and 0600 with shallow SRF; focal lattice at 0730, pigmented lattice from 0100 and 0130 - no SRF - s/p laser retinopexy OS (06.19.23) - s/p  touch up laser in OR (06.29.23) -- good laser changes around 1030 and 1100 VR tufts - continue to monitor  4. Mixed Cataract OS - The symptoms of cataract, surgical options,  and treatments and risks were discussed with patient. - discussed diagnosis and progression - not yet visually significant - monitor for now  5. Pseudophakia OU  - s/p CE/IOL OD (Dr. Valma Cava at [facility], 11.14.23)  - IOL in good position, doing well  - monitor  6. CME OD  - mild, early [place] OD  - will start PF and Prolensa QID OD -- Prolensa samples given  - f/u 3-4 weeks, DFE, OCT    Ophthalmic Meds Ordered this visit:  Meds ordered this encounter  Medications   Bromfenac Sodium (PROLENSA) 0.07 % SOLN    Sig: Place 1 drop into the right eye 4 (four) times daily.    Dispense:  3 mL    Refill:  6     Return for f/u 3-4 weeks, CME OD, DFE, OCT.  There are no Patient Instructions on file for this visit.  Explained the diagnoses, plan, and follow up with the patient and they expressed understanding.  Patient expressed understanding of the importance of proper follow up care.   This document serves as a record of services personally performed by Gardiner Sleeper, MD, PhD. It was created on their behalf by San Jetty. Owens Shark, OA an ophthalmic technician. The creation of this record is the provider's dictation and/or activities during the visit.    Electronically signed by: San Jetty. Owens Shark, [place] 12.11.2023 8:47 AM  Gardiner Sleeper, M.D., Ph.D. Diseases & Surgery of the Retina and Vitreous [facility]  I have reviewed the above documentation for accuracy and completeness, and I agree with the above. Gardiner Sleeper, M.D., Ph.D. 01/28/22 8:49 AM   Abbreviations: M myopia (nearsighted); A astigmatism; H hyperopia (farsighted); P presbyopia; Mrx spectacle prescription;  CTL contact lenses; OD right eye; OS left eye; OU both eyes  XT exotropia; ET esotropia; PEK punctate epithelial keratitis; PEE punctate epithelial erosions; DES dry eye syndrome; MGD meibomian gland dysfunction; ATs artificial tears; PFAT's preservative free artificial tears; [place] nuclear sclerotic  cataract; PSC posterior subcapsular cataract; ERM epi-retinal membrane; PVD posterior vitreous detachment; RD retinal detachment; DM diabetes mellitus; DR diabetic retinopathy; NPDR non-proliferative diabetic retinopathy; PDR proliferative diabetic retinopathy; CSME clinically significant macular edema; DME diabetic macular edema; dbh dot blot hemorrhages; CWS cotton wool spot; POAG primary open angle glaucoma; C/D cup-to-disc ratio; HVF humphrey visual field; GVF goldmann visual field; OCT optical coherence tomography; IOP intraocular pressure; BRVO Branch retinal vein occlusion; CRVO central retinal vein occlusion; CRAO central retinal artery occlusion; BRAO branch retinal artery occlusion; RT retinal tear; SB scleral buckle; PPV pars plana vitrectomy; VH Vitreous hemorrhage; PRP panretinal laser photocoagulation; IVK intravitreal kenalog; VMT vitreomacular traction; MH Macular hole;  NVD neovascularization of the disc; NVE neovascularization elsewhere; AREDS age related eye disease study; ARMD age related macular degeneration; POAG primary open angle glaucoma; EBMD epithelial/anterior basement membrane dystrophy; ACIOL anterior chamber intraocular lens; IOL intraocular lens; PCIOL posterior chamber intraocular lens; Phaco/IOL phacoemulsification with intraocular lens placement; [place] photorefractive keratectomy; LASIK laser assisted in situ keratomileusis; HTN hypertension; DM diabetes mellitus; COPD chronic obstructive pulmonary disease

## 2022-01-28 ENCOUNTER — Ambulatory Visit (INDEPENDENT_AMBULATORY_CARE_PROVIDER_SITE_OTHER): Payer: 59 | Admitting: Ophthalmology

## 2022-01-28 ENCOUNTER — Encounter (INDEPENDENT_AMBULATORY_CARE_PROVIDER_SITE_OTHER): Payer: Self-pay | Admitting: Ophthalmology

## 2022-01-28 DIAGNOSIS — H33323 Round hole, bilateral: Secondary | ICD-10-CM | POA: Diagnosis not present

## 2022-01-28 DIAGNOSIS — H35412 Lattice degeneration of retina, left eye: Secondary | ICD-10-CM | POA: Diagnosis not present

## 2022-01-28 DIAGNOSIS — H25813 Combined forms of age-related cataract, bilateral: Secondary | ICD-10-CM

## 2022-01-28 DIAGNOSIS — H25812 Combined forms of age-related cataract, left eye: Secondary | ICD-10-CM

## 2022-01-28 DIAGNOSIS — H35351 Cystoid macular degeneration, right eye: Secondary | ICD-10-CM

## 2022-01-28 DIAGNOSIS — H3321 Serous retinal detachment, right eye: Secondary | ICD-10-CM | POA: Diagnosis not present

## 2022-01-28 DIAGNOSIS — Z961 Presence of intraocular lens: Secondary | ICD-10-CM

## 2022-01-28 MED ORDER — PROLENSA 0.07 % OP SOLN: 1.0000 [drp] | Freq: Four times a day (QID) | OPHTHALMIC | 6 refills | Status: DC

## 2022-02-12 ENCOUNTER — Other Ambulatory Visit (INDEPENDENT_AMBULATORY_CARE_PROVIDER_SITE_OTHER): Payer: Self-pay

## 2022-02-12 MED ORDER — PROLENSA 0.07 % OP SOLN: 1.0000 [drp] | Freq: Four times a day (QID) | OPHTHALMIC | 6 refills | Status: DC

## 2022-02-17 NOTE — Progress Notes (Shared)
[facility] Note  02/19/2022     CHIEF COMPLAINT Patient presents for Retina Follow Up   HISTORY OF PRESENT ILLNESS: Isabella Morales is a 52 y.o. female who presents to the clinic today for:   HPI     Retina Follow Up   Patient presents with  Retinal Break/Detachment.  In right eye.  This started 4 weeks ago.  Duration of 4 weeks.  I, the attending physician,  performed the HPI with the patient and updated documentation appropriately.        Comments   4 week retina follow up CME pt is reporting that vision seems about the same she is still having floaters with some shimmering towards the bottom in OD       Last edited by Bernarda Caffey, MD on 02/20/2022 11:35 PM.    Pt is using drops as directed, she is using a nose spray for dry eyes per Dr. Valma Cava   Referring physician: No referring provider defined for this encounter.  HISTORICAL INFORMATION:   Selected notes from the MEDICAL RECORD NUMBER Referred by [facility] for mac off RD LEE:  Ocular Hx- PMH-    CURRENT MEDICATIONS: Current Outpatient Medications (Ophthalmic Drugs)  Medication Sig   bacitracin-polymyxin b (POLYSPORIN) ophthalmic ointment Place into the right eye 4 (four) times daily. Place a 1/2 inch ribbon of ointment into the lower eyelid. (Patient not taking: Reported on 11/24/2021)   brimonidine (ALPHAGAN) 0.2 % ophthalmic solution Place 1 drop into the right eye 2 (two) times daily. (Patient not taking: Reported on 11/24/2021)   Bromfenac Sodium (PROLENSA) 0.07 % SOLN Place 1 drop into the right eye 4 (four) times daily.   ketorolac (ACULAR) 0.5 % ophthalmic solution Place 1 drop into the right eye 4 (four) times daily.   prednisoLONE acetate (PRED FORTE) 1 % ophthalmic suspension Place 1 drop into the right eye 3 (three) times daily.   Varenicline Tartrate (TYRVAYA) 0.03 MG/ACT SOLN Place into the nose.   No current facility-administered medications for this visit.  (Ophthalmic Drugs)   Current Outpatient Medications (Other)  Medication Sig   CALCIUM PO Take 2 tablets by mouth daily. Gummie (Patient not taking: Reported on 01/28/2022)   HYDROcodone-acetaminophen (NORCO/VICODIN) 5-325 MG tablet Take 1 tablet by mouth every 4 (four) hours as needed for moderate pain. (Patient not taking: Reported on 11/24/2021)   No current facility-administered medications for this visit. (Other)   REVIEW OF SYSTEMS: ROS   Positive for: Eyes Last edited by Bernarda Caffey, MD on 02/20/2022 11:35 PM.     ALLERGIES No Known Allergies  PAST MEDICAL HISTORY Past Medical History:  Diagnosis Date   Medical history non-contributory    Past Surgical History:  Procedure Laterality Date   CATARACT EXTRACTION Right    Dr. Valma Cava @ [place].   EYE EXAMINATION UNDER ANESTHESIA Left 08/13/2021   Procedure: LEFT EYE EXAM UNDER ANESTHESIA WITH LASER INDIRECT OPHTHALMOSCOPY;  Surgeon: Bernarda Caffey, MD;  Location: [place];  Service: Ophthalmology;  Laterality: Left;   GAS INSERTION Right 08/13/2021   Procedure: INSERTION OF GAS;  Surgeon: Bernarda Caffey, MD;  Location: [place];  Service: Ophthalmology;  Laterality: Right;   GAS/FLUID EXCHANGE Right 08/13/2021   Procedure: GAS/FLUID EXCHANGE;  Surgeon: Bernarda Caffey, MD;  Location: [place];  Service: Ophthalmology;  Laterality: Right;   LASER PHOTO ABLATION Right 08/13/2021   Procedure: LASER PHOTO ABLATION;  Surgeon: Bernarda Caffey, MD;  Location: [place];  Service: Ophthalmology;  Laterality: Right;   PERFLUORONE INJECTION Right 08/13/2021   Procedure: PERFLUORONE INJECTION;  Surgeon: Bernarda Caffey, MD;  Location: [place];  Service: Ophthalmology;  Laterality: Right;   SCLERAL BUCKLE Right 08/13/2021   Procedure: SCLERAL BUCKLE PROCEDURE IN RIGHT EYE;  Surgeon: Bernarda Caffey, MD;  Location: [place];  Service: Ophthalmology;  Laterality: Right;   VITRECTOMY 25 GAUGE WITH SCLERAL BUCKLE Right 08/13/2021   Procedure: VITRECTOMY 25 GAUGE;   Surgeon: Bernarda Caffey, MD;  Location: [place];  Service: Ophthalmology;  Laterality: Right;   FAMILY HISTORY Family History  Problem Relation Age of Onset   Diabetes Mother    Heart disease Father    SOCIAL HISTORY Social History   Tobacco Use   Smoking status: Never   Smokeless tobacco: Never  Vaping Use   Vaping Use: Never used  Substance Use Topics   Alcohol use: Yes   Drug use: Not Currently       OPHTHALMIC EXAM:  Base Eye Exam     Visual Acuity (Snellen - Linear)       Right Left   Dist [place] 20/80 +1 20/25   Dist ph [place] NI          Tonometry (Tonopen, 8:37 AM)       Right Left   Pressure 18 18         Pupils       Pupils Dark Light Shape React APD   Right PERRL 3 2 Round Brisk None   Left PERRL 3 2 Round Brisk None         Visual Fields       Left Right    Full Full         Extraocular Movement       Right Left    Full, Ortho Full, Ortho         Neuro/Psych     Oriented x3: Yes   Mood/Affect: Normal         Dilation     Both eyes: 2.5% Phenylephrine @ 8:38 AM           Slit Lamp and Fundus Exam     Slit Lamp Exam       Right Left   Lids/Lashes Normal Dermatochalasis - upper lid   Conjunctiva/Sclera White and quiet White and quiet   Cornea 2+ Punctate epithelial erosions, endo pigment, well healed cataract wound Trace tear film debris   Anterior Chamber deep and clear, 1+cell/pigment deep, clear   Iris Round and dilated, focal atrophy at 1030 Round and dilated   Lens PC IOL in good position, 1-2+ Posterior capsular opacification; small PC opening 2+ Nuclear sclerosis, 2+ Cortical cataract   Anterior Vitreous post vitrectomy, trace pigment mild syneresis         Fundus Exam       Right Left   Disc Compact, mild Pallor, Sharp rim Pink and Sharp   C/D Ratio 0.1 0.1   Macula Flat, Blunted foveal reflex, mild ERM, trace cystic changes centrally -- improved Flat, Good foveal reflex, No heme or edema   Vessels  attenuated, mild tortuosity, mild copper wiring mild attenuation, mild tortuosity, copper wiring   Periphery Retina attached over buckle, good buckle height, good laser changes over buckle, ORIGINALLY: bullous RD from 7408-1448 with retinal tears at 1030, 1100, 1200 and 0700 Attached, pigment lattice from 0500-0600 with retinal holes at 0500 and 0600 with shallow SRF, focal lattice at 0730, pigmented lattice at 0100 and 0130 - no  SRF; VR tufts at 1030 and 11; all lesion with good laser changes surrounding, no new RT/RD           IMAGING AND PROCEDURES  Imaging and Procedures for 02/19/2022  OCT, Retina - OU - Both Eyes       Right Eye Quality was good. Central Foveal Thickness: 332. Progression has improved. Findings include abnormal foveal contour, epiretinal membrane, intraretinal fluid, macular pucker, subretinal fluid, outer retinal atrophy (retina reattached; interval improvement in central cystic changes - just trace cyst remains; patchy ORA -- improving, improving slivers of SRF superior periphery caught on widefield, mild ERM and pucker).   Left Eye Quality was good. Central Foveal Thickness: 309. Progression has been stable. Findings include normal foveal contour, no IRF, no SRF, vitreomacular adhesion .   Notes *Images captured and stored on drive  Diagnosis / Impression:  OD: retina reattached; interval improvement in central cystic changes - just trace cyst remains; patchy ORA -- improving, improving slivers of SRF superior periphery caught on widefield, mild ERM and pucker OS: NFP, no IRF/SRF  Clinical management:  See below  Abbreviations: NFP - Normal foveal profile. CME - cystoid macular edema. PED - pigment epithelial detachment. IRF - intraretinal fluid. SRF - subretinal fluid. EZ - ellipsoid zone. ERM - epiretinal membrane. ORA - outer retinal atrophy. ORT - outer retinal tubulation. SRHM - subretinal hyper-reflective material. IRHM - intraretinal hyper-reflective  material            ASSESSMENT/PLAN:    ICD-10-CM   1. 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 Retinal hole of both eyes  H33.323     4. 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 Pseudophakia  Z96.1     6. Cystoid macular edema of right eye  H35.351 OCT, Retina - OU - Both Eyes     Rhegmatogenous retinal detachment, OD - bullous temporal mac off detachment, onset of foveal involvement 6.14.23 by pt history -- maybe earlier - detached temporally from 0600-1230 oclock, fovea off, +corrugations - 4 tears within detachment -- 0730, 1030, 1100, and 1200; +lattice degen within detachment also - s/p SBP + PPV/PFO/EL/FAX/14% C3F8 OD, 06.29.23             - doing well             - retina attached and in good position under gas-- good buckle height and laser around breaks             - IOP 18 today  - AT's prn  - f/u 3-4 wks months, DFE OU  2,3. Lattice degeneration w/ atrophic holes, left eye - pigmented lattice from 0500-0600 with retinal holes at 0500 and 0600 with shallow SRF; focal lattice at 0730, pigmented lattice from 0100 and 0130 - no SRF - s/p laser retinopexy OS (06.19.23) - s/p touch up laser in OR (06.29.23) -- good laser changes around 1030 and 1100 VR tufts - monitor for now  4. Mixed Cataract OS - The symptoms of cataract, surgical options, and treatments and risks were discussed with patient. - discussed diagnosis and progression - not yet visually significant - monitor for now  5. Pseudophakia OU  - s/p CE/IOL OD (Dr. Valma Cava at [facility], 11.14.23)  - IOL in good position, doing well  - monitor  6. CME OD  -  mild, early [place] OD  - OCT shows interval improvement in central cystic chaanges -- just tr cystic changes remain  - cont PF and Prolensa OD -- decrease to TID  - f/u 4 weeks, DFE, OCT    Ophthalmic Meds Ordered this visit:  Meds ordered  this encounter  Medications   prednisoLONE acetate (PRED FORTE) 1 % ophthalmic suspension    Sig: Place 1 drop into the right eye 3 (three) times daily.    Dispense:  15 mL    Refill:  1     Return in about 4 weeks (around 03/19/2022) for f/u CME OD, DFE, OCT.  There are no Patient Instructions on file for this visit.    This document serves as a record of services personally performed by Gardiner Sleeper, MD, PhD. It was created on their behalf by Bernarda Caffey, MD, an ophthalmic technician. The creation of this record is the provider's dictation and/or activities during the visit.    Electronically signed by: Bernarda Caffey, MD 02/19/2022 11:40 PM  Gardiner Sleeper, M.D., Ph.D. Diseases & Surgery of the Retina and Vitreous [facility]  I have reviewed the above documentation for accuracy and completeness, and I agree with the above. Gardiner Sleeper, M.D., Ph.D. 02/20/22 11:40 PM  Abbreviations: M myopia (nearsighted); A astigmatism; H hyperopia (farsighted); P presbyopia; Mrx spectacle prescription;  CTL contact lenses; OD right eye; OS left eye; OU both eyes  XT exotropia; ET esotropia; PEK punctate epithelial keratitis; PEE punctate epithelial erosions; DES dry eye syndrome; MGD meibomian gland dysfunction; ATs artificial tears; PFAT's preservative free artificial tears; [place] nuclear sclerotic cataract; PSC posterior subcapsular cataract; ERM epi-retinal membrane; PVD posterior vitreous detachment; RD retinal detachment; DM diabetes mellitus; DR diabetic retinopathy; NPDR non-proliferative diabetic retinopathy; PDR proliferative diabetic retinopathy; CSME clinically significant macular edema; DME diabetic macular edema; dbh dot blot hemorrhages; CWS cotton wool spot; POAG primary open angle glaucoma; C/D cup-to-disc ratio; HVF humphrey visual field; GVF goldmann visual field; OCT optical coherence tomography; IOP intraocular pressure; BRVO Branch retinal vein occlusion;  CRVO central retinal vein occlusion; CRAO central retinal artery occlusion; BRAO branch retinal artery occlusion; RT retinal tear; SB scleral buckle; PPV pars plana vitrectomy; VH Vitreous hemorrhage; PRP panretinal laser photocoagulation; IVK intravitreal kenalog; VMT vitreomacular traction; MH Macular hole;  NVD neovascularization of the disc; NVE neovascularization elsewhere; AREDS age related eye disease study; ARMD age related macular degeneration; POAG primary open angle glaucoma; EBMD epithelial/anterior basement membrane dystrophy; ACIOL anterior chamber intraocular lens; IOL intraocular lens; PCIOL posterior chamber intraocular lens; Phaco/IOL phacoemulsification with intraocular lens placement; [place] photorefractive keratectomy; LASIK laser assisted in situ keratomileusis; HTN hypertension; DM diabetes mellitus; COPD chronic obstructive pulmonary disease

## 2022-02-19 ENCOUNTER — Ambulatory Visit (INDEPENDENT_AMBULATORY_CARE_PROVIDER_SITE_OTHER): Payer: 59 | Admitting: Ophthalmology

## 2022-02-19 DIAGNOSIS — H35351 Cystoid macular degeneration, right eye: Secondary | ICD-10-CM | POA: Diagnosis not present

## 2022-02-19 DIAGNOSIS — H25812 Combined forms of age-related cataract, left eye: Secondary | ICD-10-CM | POA: Diagnosis not present

## 2022-02-19 DIAGNOSIS — H33323 Round hole, bilateral: Secondary | ICD-10-CM

## 2022-02-19 DIAGNOSIS — H35412 Lattice degeneration of retina, left eye: Secondary | ICD-10-CM

## 2022-02-19 DIAGNOSIS — H3321 Serous retinal detachment, right eye: Secondary | ICD-10-CM

## 2022-02-19 DIAGNOSIS — Z961 Presence of intraocular lens: Secondary | ICD-10-CM

## 2022-02-19 MED ORDER — PREDNISOLONE ACETATE 1 % OP SUSP: 1.0000 [drp] | Freq: Three times a day (TID) | OPHTHALMIC | 1 refills | Status: DC

## 2022-02-20 ENCOUNTER — Encounter (INDEPENDENT_AMBULATORY_CARE_PROVIDER_SITE_OTHER): Payer: Self-pay | Admitting: Ophthalmology

## 2022-03-05 ENCOUNTER — Other Ambulatory Visit (INDEPENDENT_AMBULATORY_CARE_PROVIDER_SITE_OTHER): Payer: Self-pay

## 2022-03-05 MED ORDER — PROLENSA 0.07 % OP SOLN: 1.0000 [drp] | Freq: Three times a day (TID) | OPHTHALMIC | 6 refills | Status: DC

## 2022-03-18 NOTE — Progress Notes (Signed)
[facility] Note  03/19/2022     CHIEF COMPLAINT Patient presents for Retina Follow Up   HISTORY OF PRESENT ILLNESS: Isabella Morales is a 52 y.o. female who presents to the clinic today for:   HPI     Retina Follow Up   In right eye.  This started 4 weeks ago.  Duration of 4 weeks.  Since onset it is stable.  I, the attending physician,  performed the HPI with the patient and updated documentation appropriately.        Comments   Retina follow up CME OD pt states vision seems about the same since her last visit she is seeing some shimmering toward the bottom of the left eye  pt is using tyrvaya bid and thinks it has helped not used within the last week       Last edited by Bernarda Caffey, MD on 03/19/2022  8:11 AM.    Pt states she is using a nose spray for dryness, she states her eye felt better when she used it and she does feel like it helped the vision a little bit, but she has not noticed a significant difference in vision, she is using PF and Prolensa TID OD, she is scheduled for [facility] on 02.21.24  Referring physician: No referring provider defined for this encounter.  HISTORICAL INFORMATION:   Selected notes from the MEDICAL RECORD NUMBER Referred by [facility] for mac off RD LEE:  Ocular Hx- PMH-    CURRENT MEDICATIONS: Current Outpatient Medications (Ophthalmic Drugs)  Medication Sig   bacitracin-polymyxin b (POLYSPORIN) ophthalmic ointment Place into the right eye 4 (four) times daily. Place a 1/2 inch ribbon of ointment into the lower eyelid. (Patient not taking: Reported on 11/24/2021)   brimonidine (ALPHAGAN) 0.2 % ophthalmic solution Place 1 drop into the right eye 2 (two) times daily. (Patient not taking: Reported on 11/24/2021)   Bromfenac Sodium (PROLENSA) 0.07 % SOLN Place 1 drop into the right eye in the morning, at noon, and at bedtime.   ketorolac (ACULAR) 0.5 % ophthalmic solution Place 1 drop into the right eye 4  (four) times daily.   prednisoLONE acetate (PRED FORTE) 1 % ophthalmic suspension Place 1 drop into the right eye 3 (three) times daily.   Varenicline Tartrate (TYRVAYA) 0.03 MG/ACT SOLN Place into the nose.   No current facility-administered medications for this visit. (Ophthalmic Drugs)   Current Outpatient Medications (Other)  Medication Sig   CALCIUM PO Take 2 tablets by mouth daily. Gummie (Patient not taking: Reported on 01/28/2022)   HYDROcodone-acetaminophen (NORCO/VICODIN) 5-325 MG tablet Take 1 tablet by mouth every 4 (four) hours as needed for moderate pain. (Patient not taking: Reported on 11/24/2021)   No current facility-administered medications for this visit. (Other)   REVIEW OF SYSTEMS: ROS   Positive for: Eyes Last edited by Bernarda Caffey, MD on 03/19/2022  8:11 AM.     ALLERGIES No Known Allergies  PAST MEDICAL HISTORY Past Medical History:  Diagnosis Date   Medical history non-contributory    Past Surgical History:  Procedure Laterality Date   CATARACT EXTRACTION Right    Dr. Valma Cava @ [facility].   EYE EXAMINATION UNDER ANESTHESIA Left 08/13/2021   Procedure: LEFT EYE EXAM UNDER ANESTHESIA WITH LASER INDIRECT OPHTHALMOSCOPY;  Surgeon: Bernarda Caffey, MD;  Location: [facility];  Service: Ophthalmology;  Laterality: Left;   GAS INSERTION Right 08/13/2021   Procedure: INSERTION OF GAS;  Surgeon: Bernarda Caffey, MD;  Location: [facility];  Service: Ophthalmology;  Laterality: Right;   GAS/FLUID EXCHANGE Right 08/13/2021   Procedure: GAS/FLUID EXCHANGE;  Surgeon: Bernarda Caffey, MD;  Location: [facility];  Service: Ophthalmology;  Laterality: Right;   LASER PHOTO ABLATION Right 08/13/2021   Procedure: LASER PHOTO ABLATION;  Surgeon: Bernarda Caffey, MD;  Location: [facility] [place];  Service: Ophthalmology;  Laterality: Right;   PERFLUORONE INJECTION Right 08/13/2021   Procedure: PERFLUORONE INJECTION;  Surgeon: Bernarda Caffey, MD;  Location: [place];  Service: Ophthalmology;  Laterality: Right;    SCLERAL BUCKLE Right 08/13/2021   Procedure: SCLERAL BUCKLE PROCEDURE IN RIGHT EYE;  Surgeon: Bernarda Caffey, MD;  Location: [place];  Service: Ophthalmology;  Laterality: Right;   VITRECTOMY 25 GAUGE WITH SCLERAL BUCKLE Right 08/13/2021   Procedure: VITRECTOMY 25 GAUGE;  Surgeon: Bernarda Caffey, MD;  Location: [place];  Service: Ophthalmology;  Laterality: Right;   FAMILY HISTORY Family History  Problem Relation Age of Onset   Diabetes Mother    Heart disease Father    SOCIAL HISTORY Social History   Tobacco Use   Smoking status: Never   Smokeless tobacco: Never  Vaping Use   Vaping Use: Never used  Substance Use Topics   Alcohol use: Yes   Drug use: Not Currently       OPHTHALMIC EXAM:  Base Eye Exam     Visual Acuity (Snellen - Linear)       Right Left   Dist [place] 20/150 20/25   Dist ph [place] 20/100          Tonometry (Tonopen, 8:01 AM)       Right Left   Pressure 17 18         Pupils       Pupils Dark Light Shape React APD   Right PERRL 3 2 Round Brisk None   Left PERRL 3 2 Round Brisk None         Visual Fields       Left Right    Full Full         Extraocular Movement       Right Left    Full, Ortho Full, Ortho         Neuro/Psych     Oriented x3: Yes   Mood/Affect: Normal         Dilation     Both eyes: 2.5% Phenylephrine @ 7:58 AM           Slit Lamp and Fundus Exam     Slit Lamp Exam       Right Left   Lids/Lashes Normal Dermatochalasis - upper lid   Conjunctiva/Sclera White and quiet White and quiet   Cornea 2-3+ Punctate epithelial erosions, mild endo pigment, well healed cataract wound Trace tear film debris   Anterior Chamber deep and clear, 1+fine cell/pigment deep, clear   Iris Round and dilated, focal atrophy at 1030 Round and dilated   Lens PC IOL in good position, 2-3+ Posterior capsular opacification 2+ Nuclear sclerosis, 2+ Cortical cataract   Anterior Vitreous post vitrectomy, trace pigment mild syneresis          Fundus Exam       Right Left   Disc Compact, mild Pallor, Sharp rim Pink and Sharp   C/D Ratio 0.1 0.1   Macula Flat, Blunted foveal reflex, mild ERM, trace cystic changes centrally, No heme or edema Flat, Good foveal reflex, No heme or edema   Vessels attenuated, mild copper wiring, Tortuous attenuated, mild  tortuosity   Periphery Retina attached over buckle, good buckle height, good laser changes over buckle, retinal cyst at 0630; ORIGINALLY: bullous RD from 0600-1230 with retinal tears at 1030, 1100, 1200 and 0700 Attached, pigment lattice from 0500-0600 with retinal holes at 0500 and 0600 with shallow SRF, focal lattice at 0730, pigmented lattice at 0100 and 0130 - no SRF; VR tufts at 1030 and 11; all lesion with good laser changes surrounding, no new RT/RD           IMAGING AND PROCEDURES  Imaging and Procedures for 03/19/2022  OCT, Retina - OU - Both Eyes       Right Eye Quality was borderline. Central Foveal Thickness: 352. Progression has been stable. Findings include abnormal foveal contour, epiretinal membrane, intraretinal fluid, macular pucker, subretinal fluid, outer retinal atrophy (retina reattached; mild progression of ERM with mild pucker and central thickening, mild increase in central cystic changes; focal patches of SRF superior periphery caught on widefield - improved).   Left Eye Quality was good. Central Foveal Thickness: 306. Progression has been stable. Findings include normal foveal contour, no IRF, no SRF, vitreomacular adhesion .   Notes *Images captured and stored on drive  Diagnosis / Impression:  OD: retina reattached; mild progression of ERM with mild pucker and central thickening, mild increase in central cystic changes; focal patches of SRF superior periphery caught on widefield - improved OS: NFP, no IRF/SRF  Clinical management:  See below  Abbreviations: NFP - Normal foveal profile. CME - cystoid macular edema. PED - pigment  epithelial detachment. IRF - intraretinal fluid. SRF - subretinal fluid. EZ - ellipsoid zone. ERM - epiretinal membrane. ORA - outer retinal atrophy. ORT - outer retinal tubulation. SRHM - subretinal hyper-reflective material. IRHM - intraretinal hyper-reflective material            ASSESSMENT/PLAN:    ICD-10-CM   1. 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 Retinal hole of both eyes  H33.323     4. 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 Pseudophakia  Z96.1     6. Cystoid macular edema of right eye  H35.351       Rhegmatogenous retinal detachment, OD - bullous temporal mac off detachment, onset of foveal involvement 6.14.23 by pt history -- maybe earlier - detached temporally from 0600-1230 oclock, fovea off, +corrugations - 4 tears within detachment -- 0730, 1030, 1100, and 1200; +lattice degen within detachment also - s/p SBP + PPV/PFO/EL/FAX/14% C3F8 OD, 06.29.23             - doing well             - retina attached and in good position under gas-- good buckle height and laser around breaks             - IOP 17 today  - AT's prn  - f/u 4 weeks, DFE OU  2,3. Lattice degeneration w/ atrophic holes, left eye - pigmented lattice from 0500-0600 with retinal holes at 0500 and 0600 with shallow SRF; focal lattice at 0730, pigmented lattice from 0100 and 0130 - no SRF - s/p laser retinopexy OS (06.19.23) - s/p touch up laser in OR (06.29.23) -- good laser changes around 1030 and 1100 VR tufts - monitor  4. Mixed Cataract OS - The symptoms of cataract, surgical options, and  treatments and risks were discussed with patient. - discussed diagnosis and progression - not yet visually significant - monitor  5. Pseudophakia OU  - s/p CE/IOL OD (Dr. Valma Cava at [facility], 11.14.23)  - IOL in good position, doing well  - monitor  6. CME OD  - mild, early [place] OD  - OCT  shows mild increase in central cystic changes  - cont PF and Prolensa TID OD  - f/u 4 weeks, DFE, OCT  Ophthalmic Meds Ordered this visit:  No orders of the defined types were placed in this encounter.    Return in about 4 weeks (around 04/16/2022) for f/u CME OD, DFE, OCT.  There are no Patient Instructions on file for this visit.   This document serves as a record of services personally performed by Gardiner Sleeper, MD, PhD. It was created on their behalf by Joetta Manners COT, an ophthalmic technician. The creation of this record is the provider's dictation and/or activities during the visit.    Electronically signed by: Joetta Manners COT 03/18/2022 12:03 AM  This document serves as a record of services personally performed by Gardiner Sleeper, MD, PhD. It was created on their behalf by San Jetty. Owens Shark, OA an ophthalmic technician. The creation of this record is the provider's dictation and/or activities during the visit.    Electronically signed by: San Jetty. Owens Shark, [place] 02.02.2024 12:03 AM  Gardiner Sleeper, M.D., Ph.D. Diseases & Surgery of the Retina and [place] 03/19/2022   I have reviewed the above documentation for accuracy and completeness, and I agree with the above. Gardiner Sleeper, M.D., Ph.D. 03/21/22 12:06 AM  Abbreviations: M myopia (nearsighted); A astigmatism; H hyperopia (farsighted); P presbyopia; Mrx spectacle prescription;  CTL contact lenses; OD right eye; OS left eye; OU both eyes  XT exotropia; ET esotropia; PEK punctate epithelial keratitis; PEE punctate epithelial erosions; DES dry eye syndrome; MGD meibomian gland dysfunction; ATs artificial tears; PFAT's preservative free artificial tears; [place] nuclear sclerotic cataract; PSC posterior subcapsular cataract; ERM epi-retinal membrane; PVD posterior vitreous detachment; RD retinal detachment; DM diabetes mellitus; DR diabetic retinopathy; NPDR non-proliferative diabetic  retinopathy; PDR proliferative diabetic retinopathy; CSME clinically significant macular edema; DME diabetic macular edema; dbh dot blot hemorrhages; CWS cotton wool spot; POAG primary open angle glaucoma; C/D cup-to-disc ratio; HVF humphrey visual field; GVF goldmann visual field; OCT optical coherence tomography; IOP intraocular pressure; BRVO Branch retinal vein occlusion; CRVO central retinal vein occlusion; CRAO central retinal artery occlusion; BRAO branch retinal artery occlusion; RT retinal tear; SB scleral buckle; PPV pars plana vitrectomy; VH Vitreous hemorrhage; PRP panretinal laser photocoagulation; IVK intravitreal kenalog; VMT vitreomacular traction; MH Macular hole;  NVD neovascularization of the disc; NVE neovascularization elsewhere; AREDS age related eye disease study; ARMD age related macular degeneration; POAG primary open angle glaucoma; EBMD epithelial/anterior basement membrane dystrophy; ACIOL anterior chamber intraocular lens; IOL intraocular lens; PCIOL posterior chamber intraocular lens; Phaco/IOL phacoemulsification with intraocular lens placement; [place] photorefractive keratectomy; LASIK laser assisted in situ keratomileusis; HTN hypertension; DM diabetes mellitus; COPD chronic obstructive pulmonary disease

## 2022-03-19 ENCOUNTER — Ambulatory Visit (INDEPENDENT_AMBULATORY_CARE_PROVIDER_SITE_OTHER): Payer: 59 | Admitting: Ophthalmology

## 2022-03-19 ENCOUNTER — Encounter (INDEPENDENT_AMBULATORY_CARE_PROVIDER_SITE_OTHER): Payer: Self-pay | Admitting: Ophthalmology

## 2022-03-19 DIAGNOSIS — H35351 Cystoid macular degeneration, right eye: Secondary | ICD-10-CM

## 2022-03-19 DIAGNOSIS — H3321 Serous retinal detachment, right eye: Secondary | ICD-10-CM

## 2022-03-19 DIAGNOSIS — H25813 Combined forms of age-related cataract, bilateral: Secondary | ICD-10-CM

## 2022-03-19 DIAGNOSIS — H25812 Combined forms of age-related cataract, left eye: Secondary | ICD-10-CM | POA: Diagnosis not present

## 2022-03-19 DIAGNOSIS — H33323 Round hole, bilateral: Secondary | ICD-10-CM

## 2022-03-19 DIAGNOSIS — H35412 Lattice degeneration of retina, left eye: Secondary | ICD-10-CM

## 2022-03-19 DIAGNOSIS — Z961 Presence of intraocular lens: Secondary | ICD-10-CM

## 2022-03-21 NOTE — Progress Notes (Incomplete)
[facility] Note  03/19/2022     CHIEF COMPLAINT Patient presents for Retina Follow Up   HISTORY OF PRESENT ILLNESS: Isabella Morales is a 52 y.o. female who presents to the clinic today for:   HPI     Retina Follow Up   In right eye.  This started 4 weeks ago.  Duration of 4 weeks.  Since onset it is stable.  I, the attending physician,  performed the HPI with the patient and updated documentation appropriately.        Comments   Retina follow up CME OD pt states vision seems about the same since her last visit she is seeing some shimmering toward the bottom of the left eye  pt is using tyrvaya bid and thinks it has helped not used within the last week       Last edited by Bernarda Caffey, MD on 03/19/2022  8:11 AM.    Pt states she is using a nose spray for dryness, she states her eye felt better when she used it and she does feel like it helped the vision a little bit, but she has not noticed a significant difference in vision, she is using PF and Prolensa TID OD, she is scheduled for [facility] on 02.21.24  Referring physician: No referring provider defined for this encounter.  HISTORICAL INFORMATION:   Selected notes from the MEDICAL RECORD NUMBER Referred by [facility] for mac off RD LEE:  Ocular Hx- PMH-    CURRENT MEDICATIONS: Current Outpatient Medications (Ophthalmic Drugs)  Medication Sig  . bacitracin-polymyxin b (POLYSPORIN) ophthalmic ointment Place into the right eye 4 (four) times daily. Place a 1/2 inch ribbon of ointment into the lower eyelid. (Patient not taking: Reported on 11/24/2021)  . brimonidine (ALPHAGAN) 0.2 % ophthalmic solution Place 1 drop into the right eye 2 (two) times daily. (Patient not taking: Reported on 11/24/2021)  . Bromfenac Sodium (PROLENSA) 0.07 % SOLN Place 1 drop into the right eye in the morning, at noon, and at bedtime.  Marland Kitchen ketorolac (ACULAR) 0.5 % ophthalmic solution Place 1 drop into the right eye 4  (four) times daily.  . prednisoLONE acetate (PRED FORTE) 1 % ophthalmic suspension Place 1 drop into the right eye 3 (three) times daily.  . Varenicline Tartrate (TYRVAYA) 0.03 MG/ACT SOLN Place into the nose.   No current facility-administered medications for this visit. (Ophthalmic Drugs)   Current Outpatient Medications (Other)  Medication Sig  . CALCIUM PO Take 2 tablets by mouth daily. Gummie (Patient not taking: Reported on 01/28/2022)  . HYDROcodone-acetaminophen (NORCO/VICODIN) 5-325 MG tablet Take 1 tablet by mouth every 4 (four) hours as needed for moderate pain. (Patient not taking: Reported on 11/24/2021)   No current facility-administered medications for this visit. (Other)   REVIEW OF SYSTEMS: ROS   Positive for: Eyes Last edited by Bernarda Caffey, MD on 03/19/2022  8:11 AM.     ALLERGIES No Known Allergies  PAST MEDICAL HISTORY Past Medical History:  Diagnosis Date  . Medical history non-contributory    Past Surgical History:  Procedure Laterality Date  . CATARACT EXTRACTION Right    Dr. Valma Cava @ [place].  . EYE EXAMINATION UNDER ANESTHESIA Left 08/13/2021   Procedure: LEFT EYE EXAM UNDER ANESTHESIA WITH LASER INDIRECT OPHTHALMOSCOPY;  Surgeon: Bernarda Caffey, MD;  Location: [facility];  Service: Ophthalmology;  Laterality: Left;  Marland Kitchen GAS INSERTION Right 08/13/2021   Procedure: INSERTION OF GAS;  Surgeon: Bernarda Caffey, MD;  Location: [facility];  Service: Ophthalmology;  Laterality: Right;  . GAS/FLUID EXCHANGE Right 08/13/2021   Procedure: GAS/FLUID EXCHANGE;  Surgeon: Bernarda Caffey, MD;  Location: [place];  Service: Ophthalmology;  Laterality: Right;  . LASER PHOTO ABLATION Right 08/13/2021   Procedure: LASER PHOTO ABLATION;  Surgeon: Bernarda Caffey, MD;  Location: [place];  Service: Ophthalmology;  Laterality: Right;  . [place] INJECTION Right 08/13/2021   Procedure: PERFLUORONE INJECTION;  Surgeon: Bernarda Caffey, MD;  Location: [facility];  Service: Ophthalmology;  Laterality:  Right;  . SCLERAL BUCKLE Right 08/13/2021   Procedure: SCLERAL BUCKLE PROCEDURE IN RIGHT EYE;  Surgeon: Bernarda Caffey, MD;  Location: [facility];  Service: Ophthalmology;  Laterality: Right;  . VITRECTOMY 25 GAUGE WITH SCLERAL BUCKLE Right 08/13/2021   Procedure: VITRECTOMY 25 GAUGE;  Surgeon: Bernarda Caffey, MD;  Location: [place];  Service: Ophthalmology;  Laterality: Right;   FAMILY HISTORY Family History  Problem Relation Age of Onset  . Diabetes Mother   . Heart disease Father    SOCIAL HISTORY Social History   Tobacco Use  . Smoking status: Never  . Smokeless tobacco: Never  Vaping Use  . Vaping Use: Never used  Substance Use Topics  . Alcohol use: Yes  . Drug use: Not Currently       OPHTHALMIC EXAM:  Base Eye Exam     Visual Acuity (Snellen - Linear)       Right Left   Dist [place] 20/150 20/25   Dist ph [place] 20/100          Tonometry (Tonopen, 8:01 AM)       Right Left   Pressure 17 18         Pupils       Pupils Dark Light Shape React APD   Right PERRL 3 2 Round Brisk None   Left PERRL 3 2 Round Brisk None         Visual Fields       Left Right    Full Full         Extraocular Movement       Right Left    Full, Ortho Full, Ortho         Neuro/Psych     Oriented x3: Yes   Mood/Affect: Normal         Dilation     Both eyes: 2.5% Phenylephrine @ 7:58 AM           Slit Lamp and Fundus Exam     Slit Lamp Exam       Right Left   Lids/Lashes Normal Dermatochalasis - upper lid   Conjunctiva/Sclera White and quiet White and quiet   Cornea 2-3+ Punctate epithelial erosions, mild endo pigment, well healed cataract wound Trace tear film debris   Anterior Chamber deep and clear, 1+fine cell/pigment deep, clear   Iris Round and dilated, focal atrophy at 1030 Round and dilated   Lens PC IOL in good position, 2-3+ Posterior capsular opacification 2+ Nuclear sclerosis, 2+ Cortical cataract   Anterior Vitreous post vitrectomy, trace pigment  mild syneresis         Fundus Exam       Right Left   Disc Compact, mild Pallor, Sharp rim Pink and Sharp   C/D Ratio 0.1 0.1   Macula Flat, Blunted foveal reflex, mild ERM, trace cystic changes centrally, No heme or edema Flat, Good foveal reflex, No heme or edema   Vessels attenuated, mild copper wiring, Tortuous attenuated, mild  tortuosity   Periphery Retina attached over buckle, good buckle height, good laser changes over buckle, retinal cyst at 0630; ORIGINALLY: bullous RD from 0600-1230 with retinal tears at 1030, 1100, 1200 and 0700 Attached, pigment lattice from 0500-0600 with retinal holes at 0500 and 0600 with shallow SRF, focal lattice at 0730, pigmented lattice at 0100 and 0130 - no SRF; VR tufts at 1030 and 11; all lesion with good laser changes surrounding, no new RT/RD           IMAGING AND PROCEDURES  Imaging and Procedures for 03/19/2022  OCT, Retina - OU - Both Eyes       Right Eye Quality was borderline. Central Foveal Thickness: 352. Progression has been stable. Findings include abnormal foveal contour, epiretinal membrane, intraretinal fluid, macular pucker, subretinal fluid, outer retinal atrophy (retina reattached; mild progression of ERM with mild pucker and central thickening, mild increase in central cystic changes; focal patches of SRF superior periphery caught on widefield - improved).   Left Eye Quality was good. Central Foveal Thickness: 306. Progression has been stable. Findings include normal foveal contour, no IRF, no SRF, vitreomacular adhesion .   Notes *Images captured and stored on drive  Diagnosis / Impression:  OD: retina reattached; mild progression of ERM with mild pucker and central thickening, mild increase in central cystic changes; focal patches of SRF superior periphery caught on widefield - improved OS: NFP, no IRF/SRF  Clinical management:  See below  Abbreviations: NFP - Normal foveal profile. CME - cystoid macular edema. PED -  pigment epithelial detachment. IRF - intraretinal fluid. SRF - subretinal fluid. EZ - ellipsoid zone. ERM - epiretinal membrane. ORA - outer retinal atrophy. ORT - outer retinal tubulation. SRHM - subretinal hyper-reflective material. IRHM - intraretinal hyper-reflective material            ASSESSMENT/PLAN:    ICD-10-CM   1. 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 Retinal hole of both eyes  H33.323     4. 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 Pseudophakia  Z96.1     6. Cystoid macular edema of right eye  H35.351       Rhegmatogenous retinal detachment, OD - bullous temporal mac off detachment, onset of foveal involvement 6.14.23 by pt history -- maybe earlier - detached temporally from 0600-1230 oclock, fovea off, +corrugations - 4 tears within detachment -- 0730, 1030, 1100, and 1200; +lattice degen within detachment also - s/p SBP + PPV/PFO/EL/FAX/14% C3F8 OD, 06.29.23             - doing well             - retina attached and in good position under gas-- good buckle height and laser around breaks             - IOP 17 today  - AT's prn  - f/u 4 weeks, DFE OU  2,3. Lattice degeneration w/ atrophic holes, left eye - pigmented lattice from 0500-0600 with retinal holes at 0500 and 0600 with shallow SRF; focal lattice at 0730, pigmented lattice from 0100 and 0130 - no SRF - s/p laser retinopexy OS (06.19.23) - s/p touch up laser in OR (06.29.23) -- good laser changes around 1030 and 1100 VR tufts - monitor  4. Mixed Cataract OS - The symptoms of cataract, surgical options, and  treatments and risks were discussed with patient. - discussed diagnosis and progression - not yet visually significant - monitor  5. Pseudophakia OU  - s/p CE/IOL OD (Dr. Valma Cava at [facility], 11.14.23)  - IOL in good position, doing well  - monitor  6. CME OD  - mild, early [place]  OD  - OCT shows interval improvement in central cystic chaanges -- just tr cystic changes remain  - cont PF and Prolensa OD -- decrease to TID  - f/u 4 weeks, DFE, OCT    Ophthalmic Meds Ordered this visit:  No orders of the defined types were placed in this encounter.    Return in about 4 weeks (around 04/16/2022) for f/u CME OD, DFE, OCT.  There are no Patient Instructions on file for this visit.   This document serves as a record of services personally performed by Gardiner Sleeper, MD, PhD. It was created on their behalf by Joetta Manners COT, an ophthalmic technician. The creation of this record is the provider's dictation and/or activities during the visit.    Electronically signed by: Joetta Manners COT 03/18/2022 12:03 AM  This document serves as a record of services personally performed by Gardiner Sleeper, MD, PhD. It was created on their behalf by San Jetty. Owens Shark, OA an ophthalmic technician. The creation of this record is the provider's dictation and/or activities during the visit.    Electronically signed by: San Jetty. Owens Shark, [place] 02.02.2024 12:03 AM   Abbreviations: M myopia (nearsighted); A astigmatism; H hyperopia (farsighted); P presbyopia; Mrx spectacle prescription;  CTL contact lenses; OD right eye; OS left eye; OU both eyes  XT exotropia; ET esotropia; PEK punctate epithelial keratitis; PEE punctate epithelial erosions; DES dry eye syndrome; MGD meibomian gland dysfunction; ATs artificial tears; PFAT's preservative free artificial tears; [place] nuclear sclerotic cataract; PSC posterior subcapsular cataract; ERM epi-retinal membrane; PVD posterior vitreous detachment; RD retinal detachment; DM diabetes mellitus; DR diabetic retinopathy; NPDR non-proliferative diabetic retinopathy; PDR proliferative diabetic retinopathy; CSME clinically significant macular edema; DME diabetic macular edema; dbh dot blot hemorrhages; CWS cotton wool spot; POAG primary open angle glaucoma;  C/D cup-to-disc ratio; HVF humphrey visual field; GVF goldmann visual field; OCT optical coherence tomography; IOP intraocular pressure; BRVO Branch retinal vein occlusion; CRVO central retinal vein occlusion; CRAO central retinal artery occlusion; BRAO branch retinal artery occlusion; RT retinal tear; SB scleral buckle; PPV pars plana vitrectomy; VH Vitreous hemorrhage; PRP panretinal laser photocoagulation; IVK intravitreal kenalog; VMT vitreomacular traction; MH Macular hole;  NVD neovascularization of the disc; NVE neovascularization elsewhere; AREDS age related eye disease study; ARMD age related macular degeneration; POAG primary open angle glaucoma; EBMD epithelial/anterior basement membrane dystrophy; ACIOL anterior chamber intraocular lens; IOL intraocular lens; PCIOL posterior chamber intraocular lens; Phaco/IOL phacoemulsification with intraocular lens placement; [place] photorefractive keratectomy; LASIK laser assisted in situ keratomileusis; HTN hypertension; DM diabetes mellitus; COPD chronic obstructive pulmonary disease

## 2022-04-15 NOTE — Progress Notes (Signed)
[facility] Note  04/16/2022     CHIEF COMPLAINT Patient presents for Retina Follow Up   HISTORY OF PRESENT ILLNESS: Isabella Morales is a 52 y.o. female who presents to the clinic today for:   HPI     Retina Follow Up   This started 4 weeks ago.  Duration of 4 weeks.  Since onset it is stable.  I, the attending physician,  performed the HPI with the patient and updated documentation appropriately.        Comments   4 week CME pt is reporting no vision changes noticed she had yag OD last week and Dr, Melene Plan wants her to switch her drops to lotemax and come off or reduce prolensa due to dryness of cornea       Last edited by Bernarda Caffey, MD on 04/19/2022 12:11 AM.     Pt states had yag OD last week, she states the vision is still kind of blurry and distorted, but seems to have gotten better, she is using PF and Prolensa TID, Dr. Valma Cava wants her to switch to Lotemax from PF due to corneal dryness  Referring physician: No referring provider defined for this encounter.  HISTORICAL INFORMATION:   Selected notes from the MEDICAL RECORD NUMBER Referred by [facility] for mac off RD LEE:  Ocular Hx- PMH-    CURRENT MEDICATIONS: Current Outpatient Medications (Ophthalmic Drugs)  Medication Sig   bacitracin-polymyxin b (POLYSPORIN) ophthalmic ointment Place into the right eye 4 (four) times daily. Place a 1/2 inch ribbon of ointment into the lower eyelid. (Patient not taking: Reported on 11/24/2021)   brimonidine (ALPHAGAN) 0.2 % ophthalmic solution Place 1 drop into the right eye 2 (two) times daily. (Patient not taking: Reported on 11/24/2021)   Bromfenac Sodium (PROLENSA) 0.07 % SOLN Place 1 drop into the right eye in the morning, at noon, and at bedtime.   ketorolac (ACULAR) 0.5 % ophthalmic solution Place 1 drop into the right eye 4 (four) times daily.   prednisoLONE acetate (PRED FORTE) 1 % ophthalmic suspension Place 1 drop into the right  eye 3 (three) times daily.   Varenicline Tartrate (TYRVAYA) 0.03 MG/ACT SOLN Place into the nose.   No current facility-administered medications for this visit. (Ophthalmic Drugs)   Current Outpatient Medications (Other)  Medication Sig   CALCIUM PO Take 2 tablets by mouth daily. Gummie (Patient not taking: Reported on 01/28/2022)   HYDROcodone-acetaminophen (NORCO/VICODIN) 5-325 MG tablet Take 1 tablet by mouth every 4 (four) hours as needed for moderate pain. (Patient not taking: Reported on 11/24/2021)   No current facility-administered medications for this visit. (Other)   REVIEW OF SYSTEMS: ROS   Positive for: Eyes Last edited by Parthenia Ames, COT on 04/16/2022  9:20 AM.      ALLERGIES No Known Allergies  PAST MEDICAL HISTORY Past Medical History:  Diagnosis Date   Medical history non-contributory    Past Surgical History:  Procedure Laterality Date   CATARACT EXTRACTION Right    Dr. Valma Cava @ [place].   EYE EXAMINATION UNDER ANESTHESIA Left 08/13/2021   Procedure: LEFT EYE EXAM UNDER ANESTHESIA WITH LASER INDIRECT OPHTHALMOSCOPY;  Surgeon: Bernarda Caffey, MD;  Location: [place];  Service: Ophthalmology;  Laterality: Left;   GAS INSERTION Right 08/13/2021   Procedure: INSERTION OF GAS;  Surgeon: Bernarda Caffey, MD;  Location: [place];  Service: Ophthalmology;  Laterality: Right;   GAS/FLUID EXCHANGE Right 08/13/2021   Procedure: GAS/FLUID EXCHANGE;  Surgeon: Bernarda Caffey, MD;  Location: [place];  Service: Ophthalmology;  Laterality: Right;   LASER PHOTO ABLATION Right 08/13/2021   Procedure: LASER PHOTO ABLATION;  Surgeon: Bernarda Caffey, MD;  Location: [place];  Service: Ophthalmology;  Laterality: Right;   PERFLUORONE INJECTION Right 08/13/2021   Procedure: PERFLUORONE INJECTION;  Surgeon: Bernarda Caffey, MD;  Location: [place];  Service: Ophthalmology;  Laterality: Right;   SCLERAL BUCKLE Right 08/13/2021   Procedure: SCLERAL BUCKLE PROCEDURE IN RIGHT EYE;  Surgeon: Bernarda Caffey, MD;  Location: [place];  Service: Ophthalmology;  Laterality: Right;   VITRECTOMY 25 GAUGE WITH SCLERAL BUCKLE Right 08/13/2021   Procedure: VITRECTOMY 25 GAUGE;  Surgeon: Bernarda Caffey, MD;  Location: [place];  Service: Ophthalmology;  Laterality: Right;   FAMILY HISTORY Family History  Problem Relation Age of Onset   Diabetes Mother    Heart disease Father    SOCIAL HISTORY Social History   Tobacco Use   Smoking status: Never   Smokeless tobacco: Never  Vaping Use   Vaping Use: Never used  Substance Use Topics   Alcohol use: Yes   Drug use: Not Currently       OPHTHALMIC EXAM:  Base Eye Exam     Visual Acuity (Snellen - Linear)       Right Left   Dist [place] 20/150 20/25   Dist ph [place] 20/60 NI         Tonometry (Tonopen, 9:34 AM)       Right Left   Pressure 18 18         Pupils       Pupils Dark Light Shape React APD   Right PERRL 3 2 Round Brisk None   Left PERRL 3 2 Round Brisk None         Visual Fields       Left Right    Full Full         Extraocular Movement       Right Left    Full, Ortho Full, Ortho         Neuro/Psych     Oriented x3: Yes   Mood/Affect: Normal         Dilation     Both eyes: 2.5% Phenylephrine @ 9:21 AM           Slit Lamp and Fundus Exam     Slit Lamp Exam       Right Left   Lids/Lashes Normal Dermatochalasis - upper lid   Conjunctiva/Sclera White and quiet White and quiet   Cornea 2-3+ Punctate epithelial erosions, well healed cataract wound Trace tear film debris   Anterior Chamber deep, 0.5+fine cell/pigment deep, clear   Iris Round and dilated, focal atrophy at 1030 Round and dilated   Lens PC IOL in good position with open PC 2+ Nuclear sclerosis, 2+ Cortical cataract   Anterior Vitreous post vitrectomy, clear mild syneresis         Fundus Exam       Right Left   Disc Compact, mild Pallor, Sharp rim Pink and Sharp   C/D Ratio 0.1 0.1   Macula Flat, good foveal reflex, mild ERM,  trace cystic changes centrally -- improved, No heme or edema Flat, Good foveal reflex, No heme or edema   Vessels mild attenuation, mild tortuosity attenuated, mild tortuosity   Periphery Retina attached over buckle, good buckle height, good laser changes over buckle, retinal cyst at 0630; ORIGINALLY: bullous RD from [id number] with retinal  tears at 1030, 1100, 1200 and 0700 Attached, pigment lattice from 0500-0600 with retinal holes at 0500 and 0600 with shallow SRF, focal lattice at 0730, pigmented lattice at 0100 and 0130 - no SRF; VR tufts at 1030 and 11; all lesion with good laser changes surrounding, no new RT/RD           IMAGING AND PROCEDURES  Imaging and Procedures for 04/16/2022  OCT, Retina - OU - Both Eyes       Right Eye Quality was borderline. Central Foveal Thickness: 356. Progression has improved. Findings include no IRF, abnormal foveal contour, epiretinal membrane, macular pucker, subretinal fluid, outer retinal atrophy (retina reattached; +ERM with mild pucker and central thickening, mild interval improvement in in central cystic changes; focal patches of SRF superior periphery caught on widefield - improved).   Left Eye Quality was good. Central Foveal Thickness: 314. Progression has been stable. Findings include normal foveal contour, no IRF, no SRF, vitreomacular adhesion .   Notes *Images captured and stored on drive  Diagnosis / Impression:  OD: retina reattached; +ERM with mild pucker and central thickening, mild interval improvement in central cystic changes; focal patches of SRF superior periphery caught on widefield - improved OS: NFP, no IRF/SRF  Clinical management:  See below  Abbreviations: NFP - Normal foveal profile. CME - cystoid macular edema. PED - pigment epithelial detachment. IRF - intraretinal fluid. SRF - subretinal fluid. EZ - ellipsoid zone. ERM - epiretinal membrane. ORA - outer retinal atrophy. ORT - outer retinal tubulation. SRHM -  subretinal hyper-reflective material. IRHM - intraretinal hyper-reflective material            ASSESSMENT/PLAN:    ICD-10-CM   1. 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 Retinal hole of both eyes  H33.323     4. 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 Pseudophakia  Z96.1     6. 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 Lattice degeneration w/ atrophic holes, left eye - pigmented lattice from 0500-0600 with retinal holes at 0500 and 0600 with shallow SRF; focal lattice at 0730, pigmented lattice from 0100 and 0130 - no SRF - s/p laser retinopexy OS (06.19.23) - s/p touch up laser in OR (06.29.23) -- good laser changes around 1030 and 1100 VR tufts - monitor  4. Mixed Cataract OS - The symptoms of cataract, surgical options, and treatments and risks were discussed with patient. - discussed diagnosis and progression - not yet visually significant - monitor  5. Pseudophakia OD  -  s/p CE/IOL OD (Dr. [facility]ma Cava at [facility], 11.14.23)  - IOL in good position, doing well  - monitor  6. CME OD  - mild [place] OD  - OCT shows mild interval improvement in central cystic changes  - cont PF and Prolensa TID OD -- ok to switch PF to Lotemax and decrease to BID  - f/u 4 weeks, DFE,  OCT  Ophthalmic Meds Ordered this visit:  No orders of the defined types were placed in this encounter.    Return in about 4 weeks (around 05/14/2022) for f/u CME OD, DFE, OCT.  There are no Patient Instructions on file for this visit.  This document serves as a record of services personally performed by Gardiner Sleeper, MD, PhD. It was created on their behalf by Joetta Manners COT, an ophthalmic technician. The creation of this record is the provider's dictation and/or activities during the visit.    Electronically signed by: Joetta Manners COT 04/15/2022 12:17 AM  This document serves as a record of services personally performed by Gardiner Sleeper, MD, PhD. It was created on their behalf by San Jetty. Owens Shark, OA an ophthalmic technician. The creation of this record is the provider's dictation and/or activities during the visit.    Electronically signed by: San Jetty. Owens Shark, [place] 03.01.2024 12:17 AM  Gardiner Sleeper, M.D., Ph.D. Diseases & Surgery of the Retina and [place] 04/16/2022   I have reviewed the above documentation for accuracy and completeness, and I agree with the above. Gardiner Sleeper, M.D., Ph.D. 04/19/22 12:51 AM   Abbreviations: M myopia (nearsighted); A astigmatism; H hyperopia (farsighted); P presbyopia; Mrx spectacle prescription;  CTL contact lenses; OD right eye; OS left eye; OU both eyes  XT exotropia; ET esotropia; PEK punctate epithelial keratitis; PEE punctate epithelial erosions; DES dry eye syndrome; MGD meibomian gland dysfunction; ATs artificial tears; PFAT's preservative free artificial tears; [place] nuclear sclerotic cataract; PSC posterior subcapsular cataract; ERM epi-retinal membrane; PVD posterior vitreous detachment; RD retinal detachment; DM diabetes mellitus; DR diabetic retinopathy; NPDR non-proliferative diabetic retinopathy; PDR proliferative diabetic retinopathy; CSME clinically significant macular edema; DME  diabetic macular edema; dbh dot blot hemorrhages; CWS cotton wool spot; POAG primary open angle glaucoma; C/D cup-to-disc ratio; HVF humphrey visual field; GVF goldmann visual field; OCT optical coherence tomography; IOP intraocular pressure; BRVO Branch retinal vein occlusion; CRVO central retinal vein occlusion; CRAO central retinal artery occlusion; BRAO branch retinal artery occlusion; RT retinal tear; SB scleral buckle; PPV pars plana vitrectomy; VH Vitreous hemorrhage; PRP panretinal laser photocoagulation; IVK intravitreal kenalog; VMT vitreomacular traction; MH Macular hole;  NVD neovascularization of the disc; NVE neovascularization elsewhere; AREDS age related eye disease study; ARMD age related macular degeneration; POAG primary open angle glaucoma; EBMD epithelial/anterior basement membrane dystrophy; ACIOL anterior chamber intraocular lens; IOL intraocular lens; PCIOL posterior chamber intraocular lens; Phaco/IOL phacoemulsification with intraocular lens placement; [place] photorefractive keratectomy; LASIK laser assisted in situ keratomileusis; HTN hypertension; DM diabetes mellitus; COPD chronic obstructive pulmonary disease

## 2022-04-16 ENCOUNTER — Ambulatory Visit (INDEPENDENT_AMBULATORY_CARE_PROVIDER_SITE_OTHER): Payer: 59 | Admitting: Ophthalmology

## 2022-04-16 DIAGNOSIS — H33323 Round hole, bilateral: Secondary | ICD-10-CM

## 2022-04-16 DIAGNOSIS — H35351 Cystoid macular degeneration, right eye: Secondary | ICD-10-CM

## 2022-04-16 DIAGNOSIS — H35412 Lattice degeneration of retina, left eye: Secondary | ICD-10-CM

## 2022-04-16 DIAGNOSIS — H25812 Combined forms of age-related cataract, left eye: Secondary | ICD-10-CM | POA: Diagnosis not present

## 2022-04-16 DIAGNOSIS — Z961 Presence of intraocular lens: Secondary | ICD-10-CM | POA: Diagnosis not present

## 2022-04-16 DIAGNOSIS — H3321 Serous retinal detachment, right eye: Secondary | ICD-10-CM | POA: Diagnosis not present

## 2022-04-19 ENCOUNTER — Encounter (INDEPENDENT_AMBULATORY_CARE_PROVIDER_SITE_OTHER): Payer: Self-pay | Admitting: Ophthalmology

## 2022-05-12 NOTE — Progress Notes (Signed)
[facility] Note  05/14/2022     CHIEF COMPLAINT Patient presents for Retina Follow Up   HISTORY OF PRESENT ILLNESS: Isabella Morales is a 52 y.o. female who presents to the clinic today for:   HPI     Retina Follow Up   Patient presents with  Retinal Break/Detachment.  In right eye.  This started weeks ago.  Duration of 4 weeks.  Since onset it is stable.  I, the attending physician,  performed the HPI with the patient and updated documentation appropriately.        Comments   Patient feels that the vision is about the same. She is using Prolensa OD BID, Pred OD BID, and Restasis OU BID.      Last edited by Bernarda Caffey, MD on 05/15/2022  1:31 AM.    Pt is on PF and Prolensa BID OD    Referring physician: No referring provider defined for this encounter.  HISTORICAL INFORMATION:   Selected notes from the MEDICAL RECORD NUMBER Referred by [facility] for mac off RD LEE:  Ocular Hx- PMH-    CURRENT MEDICATIONS: Current Outpatient Medications (Ophthalmic Drugs)  Medication Sig   bacitracin-polymyxin b (POLYSPORIN) ophthalmic ointment Place into the right eye 4 (four) times daily. Place a 1/2 inch ribbon of ointment into the lower eyelid. (Patient not taking: Reported on 11/24/2021)   brimonidine (ALPHAGAN) 0.2 % ophthalmic solution Place 1 drop into the right eye 2 (two) times daily. (Patient not taking: Reported on 11/24/2021)   Bromfenac Sodium (PROLENSA) 0.07 % SOLN Place 1 drop into the right eye in the morning, at noon, and at bedtime.   ketorolac (ACULAR) 0.5 % ophthalmic solution Place 1 drop into the right eye 4 (four) times daily.   prednisoLONE acetate (PRED FORTE) 1 % ophthalmic suspension Place 1 drop into the right eye 3 (three) times daily.   Varenicline Tartrate (TYRVAYA) 0.03 MG/ACT SOLN Place into the nose.   No current facility-administered medications for this visit. (Ophthalmic Drugs)   Current Outpatient  Medications (Other)  Medication Sig   CALCIUM PO Take 2 tablets by mouth daily. Gummie (Patient not taking: Reported on 01/28/2022)   HYDROcodone-acetaminophen (NORCO/VICODIN) 5-325 MG tablet Take 1 tablet by mouth every 4 (four) hours as needed for moderate pain. (Patient not taking: Reported on 11/24/2021)   No current facility-administered medications for this visit. (Other)   REVIEW OF SYSTEMS: ROS   Positive for: Eyes Negative for: Constitutional, Gastrointestinal, Neurological, Skin, Genitourinary, Musculoskeletal, HENT, Endocrine, Cardiovascular, Respiratory, Psychiatric, Allergic/Imm, Heme/Lymph Last edited by Annie Paras, COT on 05/14/2022  9:12 AM.     ALLERGIES No Known Allergies  PAST MEDICAL HISTORY Past Medical History:  Diagnosis Date   Medical history non-contributory    Past Surgical History:  Procedure Laterality Date   CATARACT EXTRACTION Right    Dr. Valma Cava @ [place].   EYE EXAMINATION UNDER ANESTHESIA Left 08/13/2021   Procedure: LEFT EYE EXAM UNDER ANESTHESIA WITH LASER INDIRECT OPHTHALMOSCOPY;  Surgeon: Bernarda Caffey, MD;  Location: [place];  Service: Ophthalmology;  Laterality: Left;   GAS INSERTION Right 08/13/2021   Procedure: INSERTION OF GAS;  Surgeon: Bernarda Caffey, MD;  Location: [place];  Service: Ophthalmology;  Laterality: Right;   GAS/FLUID EXCHANGE Right 08/13/2021   Procedure: GAS/FLUID EXCHANGE;  Surgeon: Bernarda Caffey, MD;  Location: [place];  Service: Ophthalmology;  Laterality: Right;   LASER PHOTO ABLATION Right 08/13/2021   Procedure: LASER PHOTO ABLATION;  Surgeon: Bernarda Caffey, MD;  Location: [place];  Service: Ophthalmology;  Laterality: Right;   PERFLUORONE INJECTION Right 08/13/2021   Procedure: PERFLUORONE INJECTION;  Surgeon: Bernarda Caffey, MD;  Location: [place];  Service: Ophthalmology;  Laterality: Right;   SCLERAL BUCKLE Right 08/13/2021   Procedure: SCLERAL BUCKLE PROCEDURE IN RIGHT EYE;  Surgeon: Bernarda Caffey, MD;  Location: [place];  Service: Ophthalmology;  Laterality: Right;   VITRECTOMY 25 GAUGE WITH SCLERAL BUCKLE Right 08/13/2021   Procedure: VITRECTOMY 25 GAUGE;  Surgeon: Bernarda Caffey, MD;  Location: [place];  Service: Ophthalmology;  Laterality: Right;   FAMILY HISTORY Family History  Problem Relation Age of Onset   Diabetes Mother    Heart disease Father    SOCIAL HISTORY Social History   Tobacco Use   Smoking status: Never   Smokeless tobacco: Never  Vaping Use   Vaping Use: Never used  Substance Use Topics   Alcohol use: Yes   Drug use: Not Currently       OPHTHALMIC EXAM:  Base Eye Exam     Visual Acuity (Snellen - Linear)       Right Left   Dist [place] 20/70 +1 20/25 +1   Dist ph [place] 20/60 +1          Tonometry (Tonopen, 9:17 AM)       Right Left   Pressure 21 18         Pupils       Dark Light Shape React APD   Right 3 2 Round Brisk None   Left 3 2 Round Brisk None         Visual Fields       Left Right    Full Full         Extraocular Movement       Right Left    Full, Ortho Full, Ortho         Neuro/Psych     Oriented x3: Yes   Mood/Affect: Normal         Dilation     Both eyes: 1.0% Mydriacyl, 2.5% Phenylephrine @ 9:14 AM           Slit Lamp and Fundus Exam     Slit Lamp Exam       Right Left   Lids/Lashes Normal Dermatochalasis - upper lid   Conjunctiva/Sclera White and quiet White and quiet   Cornea 2+ Punctate epithelial erosions, well healed cataract wound, tear film debris Trace tear film debris   Anterior Chamber deep, 1-2+cell/pigment deep, clear   Iris Round and moderately dilated, focal atrophy at 1030 Round and dilated   Lens PC IOL in good position with open PC 2+ Nuclear sclerosis, 2+ Cortical cataract   Anterior Vitreous post vitrectomy, clear mild syneresis         Fundus Exam       Right Left   Disc Compact, mild Pallor, Sharp rim Pink and Sharp   C/D Ratio 0.1 0.1   Macula Flat, blunted foveal reflex, mild  ERM, cystic changes centrally -- slightly increased, No heme or edema Flat, Good foveal reflex, No heme or edema   Vessels mild attenuation, mild tortuosity attenuated, mild tortuosity   Periphery Retina attached over buckle, good buckle height, good laser changes over buckle, retinal cyst at 0630; ORIGINALLY: bullous RD from [id number] with retinal tears at 1030, 1100, 1200 and 0700 Attached, pigment lattice from 0500-0600 with retinal holes at 0500 and 0600 with shallow SRF, focal  lattice at 0730, pigmented lattice at 0100 and 0130 - no SRF; VR tufts at 1030 and 11; all lesion with good laser changes surrounding, no new RT/RD           IMAGING AND PROCEDURES  Imaging and Procedures for 05/14/2022  OCT, Retina - OU - Both Eyes       Right Eye Quality was borderline. Central Foveal Thickness: 400. Progression has worsened. Findings include no IRF, abnormal foveal contour, epiretinal membrane, macular pucker, subretinal fluid, outer retinal atrophy (retina reattached; +ERM with mild pucker and central thickening, interval increase in in central cystic changes; focal patches of SRF superior periphery caught on widefield - improving).   Left Eye Quality was good. Central Foveal Thickness: 317. Progression has been stable. Findings include normal foveal contour, no IRF, no SRF, vitreomacular adhesion .   Notes *Images captured and stored on drive  Diagnosis / Impression:  OD: retina reattached; +ERM with mild pucker and central thickening, interval increase in in central cystic changes; focal patches of SRF superior periphery caught on widefield - improving OS: NFP, no IRF/SRF  Clinical management:  See below  Abbreviations: NFP - Normal foveal profile. CME - cystoid macular edema. PED - pigment epithelial detachment. IRF - intraretinal fluid. SRF - subretinal fluid. EZ - ellipsoid zone. ERM - epiretinal membrane. ORA - outer retinal atrophy. ORT - outer retinal tubulation. SRHM - subretinal  hyper-reflective material. IRHM - intraretinal hyper-reflective material            ASSESSMENT/PLAN:    ICD-10-CM   1. 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 Retinal hole of both eyes  H33.323     4. 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 Pseudophakia  Z96.1     6. 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 Lattice degeneration w/ atrophic holes, left eye - pigmented lattice from 0500-0600 with retinal holes at 0500 and 0600 with shallow SRF; focal lattice at 0730, pigmented lattice from 0100 and 0130 - no SRF - s/p laser retinopexy OS (06.19.23) - s/p touch up laser in OR (06.29.23) -- good laser changes around 1030 and 1100 VR tufts - monitor  4. Mixed Cataract OS - The symptoms of cataract, surgical options, and treatments and risks were discussed with patient. - discussed diagnosis and progression - not yet visually significant - monitor  5. Pseudophakia OD  - s/p CE/IOL OD (Dr. Valma Cava at [facility], 11.14.23)  - IOL in good position, doing well  - monitor  6. CME OD  -  mild [place] OD -- worse today  - OCT shows interval increase in CME  - pt currently on Loteprednol TID OD and Prolensa TID  - cont PF and Prolensa TID OD -- increase back to QID if [facility] is okay with  it, pt has appt with Dr. Valma Cava on April 3rd  - may benefit from STK OD if topicals fail to resolve CME  - f/u 3 months, sooner prn DFE, OCT  Ophthalmic Meds Ordered this visit:  No orders of the defined types were placed in this encounter.    Return in about 3 months (around 08/14/2022) for f/u CME OD, DFE, OCT.  There are no Patient Instructions on file for this visit.  This document serves as a record of services personally performed by Gardiner Sleeper, MD, PhD. It was created on their behalf by San Jetty. Owens Shark, OA an ophthalmic technician. The creation of this record is the provider's dictation and/or activities during the visit.    Electronically signed by: San Jetty. Owens Shark, [place] 03.27.2024 1:33 AM   Gardiner Sleeper, M.D., Ph.D. Diseases & Surgery of the Retina and Vitreous [facility]  I have reviewed the above documentation for accuracy and completeness, and I agree with the above. Gardiner Sleeper, M.D., Ph.D. 05/15/22 1:37 AM   Abbreviations: M myopia (nearsighted); A astigmatism; H hyperopia (farsighted); P presbyopia; Mrx spectacle prescription;  CTL contact lenses; OD right eye; OS left eye; OU both eyes  XT exotropia; ET esotropia; PEK punctate epithelial keratitis; PEE punctate epithelial erosions; DES dry eye syndrome; MGD meibomian gland dysfunction; ATs artificial tears; PFAT's preservative free artificial tears; [place] nuclear sclerotic cataract; PSC posterior subcapsular cataract; ERM epi-retinal membrane; PVD posterior vitreous detachment; RD retinal detachment; DM diabetes mellitus; DR diabetic retinopathy; NPDR non-proliferative diabetic retinopathy; PDR proliferative diabetic retinopathy; CSME clinically significant macular edema; DME diabetic macular edema; dbh dot blot hemorrhages; CWS cotton wool spot; POAG primary open angle glaucoma; C/D cup-to-disc ratio; HVF humphrey visual field; GVF goldmann visual field; OCT optical coherence tomography; IOP  intraocular pressure; BRVO Branch retinal vein occlusion; CRVO central retinal vein occlusion; CRAO central retinal artery occlusion; BRAO branch retinal artery occlusion; RT retinal tear; SB scleral buckle; PPV pars plana vitrectomy; VH Vitreous hemorrhage; PRP panretinal laser photocoagulation; IVK intravitreal kenalog; VMT vitreomacular traction; MH Macular hole;  NVD neovascularization of the disc; NVE neovascularization elsewhere; AREDS age related eye disease study; ARMD age related macular degeneration; POAG primary open angle glaucoma; EBMD epithelial/anterior basement membrane dystrophy; ACIOL anterior chamber intraocular lens; IOL intraocular lens; PCIOL posterior chamber intraocular lens; Phaco/IOL phacoemulsification with intraocular lens placement; [place] photorefractive keratectomy; LASIK laser assisted in situ keratomileusis; HTN hypertension; DM diabetes mellitus; COPD chronic obstructive pulmonary disease

## 2022-05-14 ENCOUNTER — Ambulatory Visit (INDEPENDENT_AMBULATORY_CARE_PROVIDER_SITE_OTHER): Payer: 59 | Admitting: Ophthalmology

## 2022-05-14 DIAGNOSIS — Z961 Presence of intraocular lens: Secondary | ICD-10-CM

## 2022-05-14 DIAGNOSIS — H35351 Cystoid macular degeneration, right eye: Secondary | ICD-10-CM | POA: Diagnosis not present

## 2022-05-14 DIAGNOSIS — H3321 Serous retinal detachment, right eye: Secondary | ICD-10-CM

## 2022-05-14 DIAGNOSIS — H25812 Combined forms of age-related cataract, left eye: Secondary | ICD-10-CM

## 2022-05-14 DIAGNOSIS — H33323 Round hole, bilateral: Secondary | ICD-10-CM

## 2022-05-14 DIAGNOSIS — H35412 Lattice degeneration of retina, left eye: Secondary | ICD-10-CM

## 2022-05-15 ENCOUNTER — Encounter (INDEPENDENT_AMBULATORY_CARE_PROVIDER_SITE_OTHER): Payer: Self-pay | Admitting: Ophthalmology

## 2022-05-21 NOTE — Progress Notes (Signed)
[facility] - Clinic Note  05/25/2022     CHIEF COMPLAINT Patient presents for Retina Follow Up   HISTORY OF PRESENT ILLNESS: Isabella Morales is a 52 y.o. female who presents to the clinic today for:   HPI     Retina Follow Up   Patient presents with  Retinal Break/Detachment.  In right eye.  This started 2 weeks ago.  Duration of 2 weeks.  Since onset it is stable.  I, the attending physician,  performed the HPI with the patient and updated documentation appropriately.        Comments   2 week retina follow up RD pt is reporting vision seems stable since her last visit denies any flashes or floaters       Last edited by Rennis Chris, MD on 05/25/2022  8:20 AM.         Referring physician: No referring provider defined for this encounter.  HISTORICAL INFORMATION:   Selected notes from the MEDICAL RECORD NUMBER Referred by [facility] for mac off RD LEE:  Ocular Hx- PMH-    CURRENT MEDICATIONS: Current Outpatient Medications (Ophthalmic Drugs)  Medication Sig   bacitracin-polymyxin b (POLYSPORIN) ophthalmic ointment Place into the right eye 4 (four) times daily. Place a 1/2 inch ribbon of ointment into the lower eyelid. (Patient not taking: Reported on 11/24/2021)   brimonidine (ALPHAGAN) 0.2 % ophthalmic solution Place 1 drop into the right eye 2 (two) times daily. (Patient not taking: Reported on 11/24/2021)   Bromfenac Sodium (PROLENSA) 0.07 % SOLN Place 1 drop into the right eye in the morning, at noon, and at bedtime.   ketorolac (ACULAR) 0.5 % ophthalmic solution Place 1 drop into the right eye 4 (four) times daily.   prednisoLONE acetate (PRED FORTE) 1 % ophthalmic suspension Place 1 drop into the right eye 3 (three) times daily.   Varenicline Tartrate (TYRVAYA) 0.03 MG/ACT SOLN Place into the nose.   No current facility-administered medications for this visit. (Ophthalmic Drugs)   Current Outpatient Medications (Other)  Medication  Sig   CALCIUM PO Take 2 tablets by mouth daily. Gummie (Patient not taking: Reported on 01/28/2022)   HYDROcodone-acetaminophen (NORCO/VICODIN) 5-325 MG tablet Take 1 tablet by mouth every 4 (four) hours as needed for moderate pain. (Patient not taking: Reported on 11/24/2021)   No current facility-administered medications for this visit. (Other)   REVIEW OF SYSTEMS: ROS   Positive for: Eyes Negative for: Constitutional, Gastrointestinal, Neurological, Skin, Genitourinary, Musculoskeletal, HENT, Endocrine, Cardiovascular, Respiratory, Psychiatric, Allergic/Imm, Heme/Lymph Last edited by Etheleen Mayhew, COT on 05/25/2022  7:54 AM.      ALLERGIES No Known Allergies  PAST MEDICAL HISTORY Past Medical History:  Diagnosis Date   Medical history non-contributory    Past Surgical History:  Procedure Laterality Date   CATARACT EXTRACTION Right    Dr. Richardson Landry @ [facility].   EYE EXAMINATION UNDER ANESTHESIA Left 08/13/2021   Procedure: LEFT EYE EXAM UNDER ANESTHESIA WITH LASER INDIRECT OPHTHALMOSCOPY;  Surgeon: Rennis Chris, MD;  Location: [facility] OR;  Service: Ophthalmology;  Laterality: Left;   GAS INSERTION Right 08/13/2021   Procedure: INSERTION OF GAS;  Surgeon: Rennis Chris, MD;  Location: [facility] OR;  Service: Ophthalmology;  Laterality: Right;   GAS/FLUID EXCHANGE Right 08/13/2021   Procedure: GAS/FLUID EXCHANGE;  Surgeon: Rennis Chris, MD;  Location: [facility] OR;  Service: Ophthalmology;  Laterality: Right;   LASER PHOTO ABLATION Right 08/13/2021   Procedure: LASER PHOTO ABLATION;  Surgeon: Rennis Chris, MD;  Location: [facility] OR;  Service: Ophthalmology;  Laterality: Right;   PERFLUORONE INJECTION Right 08/13/2021   Procedure: PERFLUORONE INJECTION;  Surgeon: Rennis Chris, MD;  Location: [facility] OR;  Service: Ophthalmology;  Laterality: Right;   SCLERAL BUCKLE Right 08/13/2021   Procedure: SCLERAL BUCKLE PROCEDURE IN RIGHT EYE;  Surgeon: Rennis Chris, MD;  Location: [facility] OR;  Service: Ophthalmology;   Laterality: Right;   VITRECTOMY 25 GAUGE WITH SCLERAL BUCKLE Right 08/13/2021   Procedure: VITRECTOMY 25 GAUGE;  Surgeon: Rennis Chris, MD;  Location: [facility] OR;  Service: Ophthalmology;  Laterality: Right;   FAMILY HISTORY Family History  Problem Relation Age of Onset   Diabetes Mother    Heart disease Father    SOCIAL HISTORY Social History   Tobacco Use   Smoking status: Never   Smokeless tobacco: Never  Vaping Use   Vaping Use: Never used  Substance Use Topics   Alcohol use: Yes   Drug use: Not Currently       OPHTHALMIC EXAM:  Base Eye Exam     Visual Acuity (Snellen - Linear)       Right Left   Dist High [place] 20/100    Dist ph [place] 20/70 20/25 -1         Tonometry (Tonopen, 7:59 AM)       Right Left   Pressure 21 22         Pupils       Pupils Dark Light Shape React APD   Right PERRL 3 2 Round Brisk None   Left PERRL 3 2 Round Brisk None         Visual Fields       Left Right    Full Full         Extraocular Movement       Right Left    Full, Ortho Full, Ortho         Neuro/Psych     Oriented x3: Yes   Mood/Affect: Normal         Dilation     Both eyes: 2.5% Phenylephrine @ 7:59 AM           Slit Lamp and Fundus Exam     Slit Lamp Exam       Right Left   Lids/Lashes Normal Dermatochalasis - upper lid   Conjunctiva/Sclera White and quiet White and quiet   Cornea 2+ Punctate epithelial erosions, well healed cataract wound, tear film debris Trace tear film debris   Anterior Chamber deep, 1-2+cell/pigment deep, clear   Iris Round and moderately dilated, focal atrophy at 1030 Round and dilated   Lens PC IOL in good position with open PC 2+ Nuclear sclerosis, 2+ Cortical cataract   Anterior Vitreous post vitrectomy, clear mild syneresis         Fundus Exam       Right Left   Disc Compact, mild Pallor, Sharp rim Pink and Sharp   C/D Ratio 0.1 0.1   Macula Flat, blunted foveal reflex, mild ERM, cystic changes centrally --  slightly increased, No heme or edema Flat, Good foveal reflex, No heme or edema   Vessels mild attenuation, mild tortuosity attenuated, mild tortuosity   Periphery Retina attached over buckle, good buckle height, good laser changes over buckle, retinal cyst at 0630; ORIGINALLY: bullous RD from 4010-2725 with retinal tears at 1030, 1100, 1200 and 0700 Attached, pigment lattice from 0500-0600 with retinal holes at 0500 and 0600 with shallow SRF, focal lattice at 0730, pigmented lattice at  0100 and 0130 - no SRF; VR tufts at 1030 and 11; all lesion with good laser changes surrounding, no new RT/RD           IMAGING AND PROCEDURES  Imaging and Procedures for 05/25/2022  OCT, Retina - OU - Both Eyes       Right Eye Quality was good. Central Foveal Thickness: 377. Progression has improved. Findings include no IRF, abnormal foveal contour, epiretinal membrane, macular pucker, subretinal fluid, outer retinal atrophy (retina reattached; +ERM with mild pucker and central thickening, mild interval improvement in central cystic changes; focal patches of SRF superior periphery caught on widefield - improving).   Left Eye Quality was good. Central Foveal Thickness: 313. Progression has been stable. Findings include normal foveal contour, no IRF, no SRF, vitreomacular adhesion .   Notes *Images captured and stored on drive  Diagnosis / Impression:  OD: retina reattached; +ERM with mild pucker and central thickening, mild interval improvement in central cystic changes; focal patches of SRF superior periphery caught on widefield - improving OS: NFP, no IRF/SRF  Clinical management:  See below  Abbreviations: NFP - Normal foveal profile. CME - cystoid macular edema. PED - pigment epithelial detachment. IRF - intraretinal fluid. SRF - subretinal fluid. EZ - ellipsoid zone. ERM - epiretinal membrane. ORA - outer retinal atrophy. ORT - outer retinal tubulation. SRHM - subretinal hyper-reflective material.  IRHM - intraretinal hyper-reflective material      Injection into Tenon's Capsule - OD - Right Eye       Time Out 05/25/2022. 8:08 AM. Confirmed correct patient, procedure, site, and patient consented.   Anesthesia Topical anesthesia was used. Anesthetic medications included Lidocaine 2%, Proparacaine 0.5%.   Procedure Preparation included 5% betadine to ocular surface. A (25g) needle was used.   Injection: 40 mg triamcinolone acetonide 40 MG/ML   Route: Other, Site: Right Eye   NDC: [id number], Lot: [id number]: [id number], Expiration date: 10/16/2023   Post-op Post injection exam found visual acuity of at least counting fingers. The patient tolerated the procedure well. There were no complications. The patient received written and verbal post procedure care education. Post injection medications included ocuflox.             ASSESSMENT/PLAN:    ICD-10-CM   1. 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 Retinal hole of both eyes  H33.323     4. 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 Pseudophakia  Z96.1     6. 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 Lattice degeneration w/ atrophic holes, left eye - pigmented lattice from 0500-0600 with retinal holes at 0500 and 0600 with shallow SRF; focal lattice at 0730, pigmented lattice from 0100 and 0130 - no SRF - s/p laser retinopexy OS (06.19.23) - s/p touch up laser in OR (06.29.23) -- good laser changes around 1030 and 1100 VR tufts - monitor  4. Mixed Cataract OS - The symptoms of cataract, surgical options, and treatments and risks were discussed with patient. - discussed diagnosis and progression - not yet visually significant - monitor  5. Pseudophakia OD  - [facility]  - IOL in good position, doing well  - monitor  6. CME OD  - mild [place]ass OD -- slightly improved today  - OCT shows interval improvement in CME  - pt currently on Loteprednol QID OD and Prolensa QID  - recommend STK OD #1 today, 04.09.24  - pt wishes to proceed with injection  - RBA of procedure discussed, questions answered - STK informed consent obtained and signed, 04.09.24 (OD) - see procedure note  - f/u 4-6 weeks, sooner prn DFE, OCT  Ophthalmic Meds Ordered this visit:  Meds ordered this encounter  Medications   triamcinolone acetonide (KENALOG-40) injection 40 mg     Return for f/u 4-6 weeks, CME OD, DFE, OCT.  There are no Patient Instructions on file for this visit.  This document serves as a record of services personally performed by Karie ChimeraBrian G. Domenic Schoenberger, MD, PhD. It was created on their behalf by Gerilyn Nestlehristine Shamlian, COT an ophthalmic technician. The creation of this record is the provider's dictation and/or activities during the visit.    Electronically signed by:  Gerilyn Nestlehristine Shamlian, COT  04.05.24 8:22 AM  This document serves as a record of services personally performed by Karie ChimeraBrian G. Jini Horiuchi, MD, PhD. It was created on their behalf by Glee ArvinAmanda J. Manson PasseyBrown, OA an ophthalmic technician. The creation of this record is the provider's  dictation and/or activities during the visit.    Electronically signed by: Glee ArvinAmanda J. Manson PasseyBrown, [place] 04.09.2024 8:22 AM  Karie ChimeraBrian G. Kymberli Wiegand, M.D., Ph.D. Diseases & Surgery of the Retina and Vitreous [facility]  I have reviewed the above documentation for accuracy and completeness, and I agree with the above. Karie ChimeraBrian G. Berdena Cisek, M.D., Ph.D. 05/25/22 8:24 AM   Abbreviations: M myopia (nearsighted); A astigmatism; H hyperopia (farsighted); P presbyopia; Mrx spectacle prescription;  CTL contact lenses; OD right eye; OS left eye; OU both eyes  XT exotropia; ET esotropia; PEK punctate epithelial keratitis; PEE punctate epithelial erosions; DES dry eye syndrome; MGD meibomian gland dysfunction; ATs artificial tears; PFAT's preservative free artificial tears; NSC nuclear sclerotic cataract; PSC posterior subcapsular cataract; ERM epi-retinal membrane; PVD posterior vitreous detachment; RD retinal detachment; DM diabetes mellitus; DR diabetic retinopathy; NPDR non-proliferative diabetic retinopathy; PDR proliferative diabetic retinopathy; CSME clinically significant macular edema; DME diabetic macular edema; dbh dot blot hemorrhages; CWS cotton wool spot; POAG primary open angle glaucoma; C/D cup-to-disc ratio; HVF humphrey visual field; GVF goldmann visual field; OCT optical coherence tomography; IOP intraocular pressure; BRVO Branch retinal vein occlusion; CRVO central retinal vein occlusion; CRAO central retinal artery occlusion; BRAO branch retinal artery occlusion; RT retinal tear; SB scleral buckle; PPV pars plana vitrectomy; VH Vitreous hemorrhage; PRP panretinal laser photocoagulation; IVK  intravitreal kenalog; VMT vitreomacular traction; MH Macular hole;  NVD neovascularization of the disc; NVE neovascularization elsewhere; AREDS age related eye disease study; ARMD age related macular degeneration; POAG primary open angle glaucoma; EBMD epithelial/anterior basement membrane dystrophy; ACIOL  anterior chamber intraocular lens; IOL intraocular lens; PCIOL posterior chamber intraocular lens; Phaco/IOL phacoemulsification with intraocular lens placement; PRK photorefractive keratectomy; LASIK laser assisted in situ keratomileusis; HTN hypertension; DM diabetes mellitus; COPD chronic obstructive pulmonary disease

## 2022-05-25 ENCOUNTER — Encounter (INDEPENDENT_AMBULATORY_CARE_PROVIDER_SITE_OTHER): Payer: Self-pay | Admitting: Ophthalmology

## 2022-05-25 ENCOUNTER — Ambulatory Visit (INDEPENDENT_AMBULATORY_CARE_PROVIDER_SITE_OTHER): Payer: 59 | Admitting: Ophthalmology

## 2022-05-25 DIAGNOSIS — H35351 Cystoid macular degeneration, right eye: Secondary | ICD-10-CM

## 2022-05-25 DIAGNOSIS — H33323 Round hole, bilateral: Secondary | ICD-10-CM

## 2022-05-25 DIAGNOSIS — H3321 Serous retinal detachment, right eye: Secondary | ICD-10-CM | POA: Diagnosis not present

## 2022-05-25 DIAGNOSIS — Z961 Presence of intraocular lens: Secondary | ICD-10-CM

## 2022-05-25 DIAGNOSIS — H25812 Combined forms of age-related cataract, left eye: Secondary | ICD-10-CM

## 2022-05-25 DIAGNOSIS — H35412 Lattice degeneration of retina, left eye: Secondary | ICD-10-CM

## 2022-05-25 MED ORDER — TRIAMCINOLONE ACETONIDE 40 MG/ML IJ SUSP FOR KALEIDOSCOPE
40.0000 mg | INTRAMUSCULAR | Status: AC | PRN
  Administered 2022-05-25: 40 mg

## 2022-05-31 ENCOUNTER — Other Ambulatory Visit (INDEPENDENT_AMBULATORY_CARE_PROVIDER_SITE_OTHER): Payer: Self-pay

## 2022-05-31 MED ORDER — BROMFENAC SODIUM 0.07 % OP SOLN: 1.0000 [drp] | Freq: Three times a day (TID) | OPHTHALMIC | 6 refills | Status: DC

## 2022-06-15 NOTE — Progress Notes (Signed)
[facility] - Clinic Note  06/29/2022     CHIEF COMPLAINT Patient presents for Retina Follow Up   HISTORY OF PRESENT ILLNESS: Isabella Morales is a 52 y.o. female who presents to the clinic today for:   HPI     Retina Follow Up   In right eye.  This started 5 weeks ago.  Duration of 5 weeks.  Since onset it is stable.  I, the attending physician,  performed the HPI with the patient and updated documentation appropriately.        Comments   6 week CME OD pt is reporting vision seems about the same she denies any flashes or floaters       Last edited by Rennis Chris, MD on 06/30/2022  5:18 PM.    Pt is using Lotemax and Prolensa QID OD, she is also using dry eye drops  Referring physician: No referring provider defined for this encounter.  HISTORICAL INFORMATION:   Selected notes from the MEDICAL RECORD NUMBER Referred by [facility] for mac off RD LEE:  Ocular Hx- PMH-    CURRENT MEDICATIONS: Current Outpatient Medications (Ophthalmic Drugs)  Medication Sig   bacitracin-polymyxin b (POLYSPORIN) ophthalmic ointment Place into the right eye 4 (four) times daily. Place a 1/2 inch ribbon of ointment into the lower eyelid. (Patient not taking: Reported on 11/24/2021)   brimonidine (ALPHAGAN) 0.2 % ophthalmic solution Place 1 drop into the right eye 2 (two) times daily. (Patient not taking: Reported on 11/24/2021)   Bromfenac Sodium (PROLENSA) 0.07 % SOLN Place 1 drop into the right eye in the morning, at noon, and at bedtime.   ketorolac (ACULAR) 0.5 % ophthalmic solution Place 1 drop into the right eye 4 (four) times daily.   prednisoLONE acetate (PRED FORTE) 1 % ophthalmic suspension Place 1 drop into the right eye 3 (three) times daily.   Varenicline Tartrate (TYRVAYA) 0.03 MG/ACT SOLN Place into the nose.   No current facility-administered medications for this visit. (Ophthalmic Drugs)   Current Outpatient Medications (Other)  Medication Sig    CALCIUM PO Take 2 tablets by mouth daily. Gummie (Patient not taking: Reported on 01/28/2022)   HYDROcodone-acetaminophen (NORCO/VICODIN) 5-325 MG tablet Take 1 tablet by mouth every 4 (four) hours as needed for moderate pain. (Patient not taking: Reported on 11/24/2021)   No current facility-administered medications for this visit. (Other)   REVIEW OF SYSTEMS: ROS   Positive for: Eyes Negative for: Constitutional, Gastrointestinal, Neurological, Skin, Genitourinary, Musculoskeletal, HENT, Endocrine, Cardiovascular, Respiratory, Psychiatric, Allergic/Imm, Heme/Lymph Last edited by Etheleen Mayhew, COT on 06/29/2022  7:53 AM.     ALLERGIES No Known Allergies  PAST MEDICAL HISTORY Past Medical History:  Diagnosis Date   Medical history non-contributory    Past Surgical History:  Procedure Laterality Date   CATARACT EXTRACTION Right    Dr. Richardson Landry @ [facility].   EYE EXAMINATION UNDER ANESTHESIA Left 08/13/2021   Procedure: LEFT EYE EXAM UNDER ANESTHESIA WITH LASER INDIRECT OPHTHALMOSCOPY;  Surgeon: Rennis Chris, MD;  Location: [facility] OR;  Service: Ophthalmology;  Laterality: Left;   GAS INSERTION Right 08/13/2021   Procedure: INSERTION OF GAS;  Surgeon: Rennis Chris, MD;  Location: [facility] OR;  Service: Ophthalmology;  Laterality: Right;   GAS/FLUID EXCHANGE Right 08/13/2021   Procedure: GAS/FLUID EXCHANGE;  Surgeon: Rennis Chris, MD;  Location: [facility] OR;  Service: Ophthalmology;  Laterality: Right;   LASER PHOTO ABLATION Right 08/13/2021   Procedure: LASER PHOTO ABLATION;  Surgeon: Rennis Chris, MD;  Location: [facility] OR;  Service: Ophthalmology;  Laterality: Right;   PERFLUORONE INJECTION Right 08/13/2021   Procedure: PERFLUORONE INJECTION;  Surgeon: Rennis Chris, MD;  Location: [facility] OR;  Service: Ophthalmology;  Laterality: Right;   SCLERAL BUCKLE Right 08/13/2021   Procedure: SCLERAL BUCKLE PROCEDURE IN RIGHT EYE;  Surgeon: Rennis Chris, MD;  Location: [facility] OR;  Service: Ophthalmology;   Laterality: Right;   VITRECTOMY 25 GAUGE WITH SCLERAL BUCKLE Right 08/13/2021   Procedure: VITRECTOMY 25 GAUGE;  Surgeon: Rennis Chris, MD;  Location: [facility] OR;  Service: Ophthalmology;  Laterality: Right;   FAMILY HISTORY Family History  Problem Relation Age of Onset   Diabetes Mother    Heart disease Father    SOCIAL HISTORY Social History   Tobacco Use   Smoking status: Never   Smokeless tobacco: Never  Vaping Use   Vaping Use: Never used  Substance Use Topics   Alcohol use: Yes   Drug use: Not Currently       OPHTHALMIC EXAM:  Base Eye Exam     Visual Acuity (Snellen - Linear)       Right Left   Dist [place] 20/80 -2 20/25 -1   Dist ph [place] 20/70 -2 NI         Tonometry (Tonopen, 8:02 AM)       Right Left   Pressure 20 21         Pupils       Pupils Dark Light Shape React APD   Right PERRL 3 2 Round Brisk None   Left PERRL 3 2 Round Brisk None         Visual Fields       Left Right    Full Full         Extraocular Movement       Right Left    Full, Ortho Full, Ortho         Neuro/Psych     Oriented x3: Yes   Mood/Affect: Normal         Dilation     Both eyes:            Slit Lamp and Fundus Exam     Slit Lamp Exam       Right Left   Lids/Lashes Normal Dermatochalasis - upper lid   Conjunctiva/Sclera Subconjunctival hemorrhage superiorly, STK ST quad White and quiet   Cornea 2-3+ Punctate epithelial erosions, well healed cataract wound, tear film debris Trace tear film debris   Anterior Chamber deep, 0.5+cell/pigment deep, clear   Iris Round and dilated, focal atrophy at 1030 Round and dilated   Lens PC IOL in good position with open PC 2+ Nuclear sclerosis, 2+ Cortical cataract   Anterior Vitreous post vitrectomy, clear mild syneresis         Fundus Exam       Right Left   Disc Compact, mild Pallor, Sharp rim Pink and Sharp   C/D Ratio 0.1 0.1   Macula Flat, blunted foveal reflex, mild ERM, cystic changes centrally  -- improved, No heme or edema Flat, Good foveal reflex, No heme or edema   Vessels mild attenuation, mild tortuosity attenuated, mild tortuosity   Periphery Retina attached over buckle, good buckle height, good laser changes over buckle, retinal cyst at 0630; ORIGINALLY: bullous RD from 7829-5621 with retinal tears at 1030, 1100, 1200 and 0700 Attached, pigment lattice from 0500-0600 with retinal holes at 0500 and 0600 with shallow SRF, focal lattice at 0730, pigmented lattice at 0100  and 0130 - no SRF; VR tufts at 1030 and 11; all lesion with good laser changes surrounding, no new RT/RD           IMAGING AND PROCEDURES  Imaging and Procedures for 06/29/2022  OCT, Retina - OU - Both Eyes       Right Eye Quality was good. Central Foveal Thickness: 383. Progression has improved. Findings include no IRF, abnormal foveal contour, epiretinal membrane, macular pucker, subretinal fluid, outer retinal atrophy (retina reattached; +ERM with mild pucker and central thickening, mild interval improvement in central cystic changes; focal patches of SRF superior periphery caught on widefield - improved, interval development of inner retinal schisis inferior periphery).   Left Eye Quality was good. Central Foveal Thickness: 312. Progression has been stable. Findings include normal foveal contour, no IRF, no SRF, vitreomacular adhesion .   Notes *Images captured and stored on drive  Diagnosis / Impression:  OD: retina reattached; +ERM with mild pucker and central thickening, mild interval improvement in central cystic changes; focal patches of SRF superior periphery caught on widefield - improved, interval development of inner retinal schisis inferior periphery OS: NFP, no IRF/SRF  Clinical management:  See below  Abbreviations: NFP - Normal foveal profile. CME - cystoid macular edema. PED - pigment epithelial detachment. IRF - intraretinal fluid. SRF - subretinal fluid. EZ - ellipsoid zone. ERM -  epiretinal membrane. ORA - outer retinal atrophy. ORT - outer retinal tubulation. SRHM - subretinal hyper-reflective material. IRHM - intraretinal hyper-reflective material            ASSESSMENT/PLAN:    ICD-10-CM   1. 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 Retinal hole of both eyes  H33.323     4. 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 Pseudophakia  Z96.1     6. 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 Lattice degeneration w/ atrophic holes, left eye - pigmented lattice from 0500-0600 with retinal holes at 0500 and 0600 with shallow SRF; focal lattice at 0730, pigmented lattice from 0100 and 0130 - no SRF - s/p laser retinopexy OS (06.19.23) - s/p touch up laser in OR (06.29.23) -- good laser changes around 1030 and 1100 VR tufts - monitor  4. Mixed Cataract OS - The symptoms of cataract, surgical options, and treatments and risks were discussed with patient. - discussed diagnosis and progression - not yet visually significant - monitor  5. Pseudophakia OD  - s/p CE/IOL OD (  Dr[facility] Richardson [facility]ry at [facility], 11.14.23)  - IOL in good position, doing well  - monitor  6. CME OD  - s/p STK OD #1 (04.09.24)  - mild [place] OD --  slightly improved today  - OCT shows interval improvement in CME  - pt currently on Loteprednol QID OD and Prolensa QID -- decrease to TID - STK informed consent obtained and signed, 04.09.24 (OD)  - f/u 6 weeks, sooner prn DFE, OCT  Ophthalmic Meds Ordered this visit:  No orders of the defined types were placed in this encounter.    Return in about 6 weeks (around 08/10/2022) for f/u CME OD, DFE, OCT.  There are no Patient Instructions on file for this visit.  This document serves as a record of services personally performed by Karie Chimera, MD, PhD. It was created on their behalf by Gerilyn Nestle, COT an ophthalmic technician. The creation of this record is the provider's dictation and/or activities during the visit.    Electronically signed by:  Gerilyn Nestle, COT  04.30.24 5:18 PM  This document serves as a record of services personally performed by Karie Chimera, MD, PhD. It was created on their behalf by Glee Arvin. Manson Passey, OA an ophthalmic technician. The creation of this record is the provider's dictation and/or activities during the visit.    Electronically signed by: Glee Arvin. Manson Passey, [place] 05.14.2024 5:18 PM  Karie Chimera, M.D., Ph.D. Diseases & Surgery of the Retina and Vitreous [facility]  I have reviewed the above documentation for accuracy and completeness, and I agree with the above. Karie Chimera, M.D., Ph.D. 06/30/22 5:19 PM   Abbreviations: M myopia (nearsighted); A astigmatism; H hyperopia (farsighted); P presbyopia; Mrx spectacle prescription;  CTL contact lenses; OD right eye; OS left eye; OU both eyes  XT exotropia; ET esotropia; PEK punctate epithelial keratitis; PEE punctate epithelial erosions; DES dry eye syndrome; MGD meibomian gland dysfunction; ATs artificial tears; PFAT's preservative free artificial tears; NSC nuclear sclerotic cataract; PSC posterior subcapsular cataract; ERM epi-retinal membrane; PVD posterior  vitreous detachment; RD retinal detachment; DM diabetes mellitus; DR diabetic retinopathy; NPDR non-proliferative diabetic retinopathy; PDR proliferative diabetic retinopathy; CSME clinically significant macular edema; DME diabetic macular edema; dbh dot blot hemorrhages; CWS cotton wool spot; POAG primary open angle glaucoma; C/D cup-to-disc ratio; HVF humphrey visual field; GVF goldmann visual field; OCT optical coherence tomography; IOP intraocular pressure; BRVO Branch retinal vein occlusion; CRVO central retinal vein occlusion; CRAO central retinal artery occlusion; BRAO branch retinal artery occlusion; RT retinal tear; SB scleral buckle; PPV pars plana vitrectomy; VH Vitreous hemorrhage; PRP panretinal laser photocoagulation; IVK intravitreal kenalog; VMT vitreomacular traction; MH Macular hole;  NVD neovascularization of the disc; NVE neovascularization elsewhere; AREDS age related eye disease study; ARMD age related macular degeneration; POAG primary open angle glaucoma; EBMD epithelial/anterior basement membrane dystrophy; ACIOL anterior chamber intraocular lens; IOL intraocular lens; PCIOL posterior chamber intraocular lens; Phaco/IOL phacoemulsification with intraocular lens placement; PRK photorefractive keratectomy; LASIK laser assisted in situ keratomileusis; HTN hypertension; DM diabetes mellitus; COPD chronic obstructive pulmonary disease

## 2022-06-29 ENCOUNTER — Ambulatory Visit (INDEPENDENT_AMBULATORY_CARE_PROVIDER_SITE_OTHER): Payer: 59 | Admitting: Ophthalmology

## 2022-06-29 ENCOUNTER — Encounter (INDEPENDENT_AMBULATORY_CARE_PROVIDER_SITE_OTHER): Payer: Self-pay | Admitting: Ophthalmology

## 2022-06-29 DIAGNOSIS — H33323 Round hole, bilateral: Secondary | ICD-10-CM | POA: Diagnosis not present

## 2022-06-29 DIAGNOSIS — H3321 Serous retinal detachment, right eye: Secondary | ICD-10-CM

## 2022-06-29 DIAGNOSIS — H35412 Lattice degeneration of retina, left eye: Secondary | ICD-10-CM

## 2022-06-29 DIAGNOSIS — H25812 Combined forms of age-related cataract, left eye: Secondary | ICD-10-CM | POA: Diagnosis not present

## 2022-06-29 DIAGNOSIS — Z961 Presence of intraocular lens: Secondary | ICD-10-CM | POA: Diagnosis not present

## 2022-06-29 DIAGNOSIS — H35351 Cystoid macular degeneration, right eye: Secondary | ICD-10-CM

## 2022-06-30 ENCOUNTER — Encounter (INDEPENDENT_AMBULATORY_CARE_PROVIDER_SITE_OTHER): Payer: Self-pay | Admitting: Ophthalmology

## 2022-07-27 NOTE — Progress Notes (Signed)
[facility] - Clinic Note  08/10/2022     CHIEF COMPLAINT Patient presents for Retina Follow Up   HISTORY OF PRESENT ILLNESS: Isabella Morales is a 52 y.o. female who presents to the clinic today for:   HPI     Retina Follow Up   Patient presents with  Other.  In right eye.  Severity is moderate.  Duration of 6 weeks.  Since onset it is stable.  I, the attending physician,  performed the HPI with the patient and updated documentation appropriately.        Comments   Pt here for 6 wk ret f/u for CME OD/RD OD. Pt states VA is about the same, still using drops. Pt is now on a new gtts from [facility] where they draw blood and create an eye drop-pt using QID OD, started in late May.       Last edited by Thompson Grayer, COT on 08/10/2022  7:54 AM.    Pt is using Lotemax and Prolensa OD TID, she is also using serum tears made at [facility] QID OD, she states her eye feels better, but she can't tell any difference in vision  Referring physician: No referring provider defined for this encounter.  HISTORICAL INFORMATION:   Selected notes from the MEDICAL RECORD NUMBER Referred by [facility] for mac off RD LEE:  Ocular Hx- PMH-    CURRENT MEDICATIONS: Current Outpatient Medications (Ophthalmic Drugs)  Medication Sig   Bromfenac Sodium (PROLENSA) 0.07 % SOLN Place 1 drop into the right eye in the morning, at noon, and at bedtime.   ketorolac (ACULAR) 0.5 % ophthalmic solution Place 1 drop into the right eye 4 (four) times daily.   prednisoLONE acetate (PRED FORTE) 1 % ophthalmic suspension Place 1 drop into the right eye 3 (three) times daily.   Varenicline Tartrate (TYRVAYA) 0.03 MG/ACT SOLN Place into the nose.   bacitracin-polymyxin b (POLYSPORIN) ophthalmic ointment Place into the right eye 4 (four) times daily. Place a 1/2 inch ribbon of ointment into the lower eyelid. (Patient not taking: Reported on 11/24/2021)   brimonidine (ALPHAGAN) 0.2 % ophthalmic  solution Place 1 drop into the right eye 2 (two) times daily. (Patient not taking: Reported on 11/24/2021)   No current facility-administered medications for this visit. (Ophthalmic Drugs)   Current Outpatient Medications (Other)  Medication Sig   CALCIUM PO Take 2 tablets by mouth daily. Gummie (Patient not taking: Reported on 01/28/2022)   HYDROcodone-acetaminophen (NORCO/VICODIN) 5-325 MG tablet Take 1 tablet by mouth every 4 (four) hours as needed for moderate pain. (Patient not taking: Reported on 11/24/2021)   No current facility-administered medications for this visit. (Other)   REVIEW OF SYSTEMS: ROS   Positive for: Eyes Negative for: Constitutional, Gastrointestinal, Neurological, Skin, Genitourinary, Musculoskeletal, HENT, Endocrine, Cardiovascular, Respiratory, Psychiatric, Allergic/Imm, Heme/Lymph Last edited by Thompson Grayer, COT on 08/10/2022  7:52 AM.      ALLERGIES No Known Allergies  PAST MEDICAL HISTORY Past Medical History:  Diagnosis Date   Medical history non-contributory    Past Surgical History:  Procedure Laterality Date   CATARACT EXTRACTION Right    Dr. Richardson Landry @ [facility].   EYE EXAMINATION UNDER ANESTHESIA Left 08/13/2021   Procedure: LEFT EYE EXAM UNDER ANESTHESIA WITH LASER INDIRECT OPHTHALMOSCOPY;  Surgeon: Rennis Chris, MD;  Location: [facility] OR;  Service: Ophthalmology;  Laterality: Left;   GAS INSERTION Right 08/13/2021   Procedure: INSERTION OF GAS;  Surgeon: Rennis Chris, MD;  Location:  [facility] OR;  Service: Ophthalmology;  Laterality: Right;   GAS/FLUID EXCHANGE Right 08/13/2021   Procedure: GAS/FLUID EXCHANGE;  Surgeon: Rennis Chris, MD;  Location: [facility] OR;  Service: Ophthalmology;  Laterality: Right;   LASER PHOTO ABLATION Right 08/13/2021   Procedure: LASER PHOTO ABLATION;  Surgeon: Rennis Chris, MD;  Location: [facility] OR;  Service: Ophthalmology;  Laterality: Right;   PERFLUORONE INJECTION Right 08/13/2021   Procedure: PERFLUORONE INJECTION;   Surgeon: Rennis Chris, MD;  Location: [facility] OR;  Service: Ophthalmology;  Laterality: Right;   SCLERAL BUCKLE Right 08/13/2021   Procedure: SCLERAL BUCKLE PROCEDURE IN RIGHT EYE;  Surgeon: Rennis Chris, MD;  Location: [facility] OR;  Service: Ophthalmology;  Laterality: Right;   VITRECTOMY 25 GAUGE WITH SCLERAL BUCKLE Right 08/13/2021   Procedure: VITRECTOMY 25 GAUGE;  Surgeon: Rennis Chris, MD;  Location: [facility] OR;  Service: Ophthalmology;  Laterality: Right;   FAMILY HISTORY Family History  Problem Relation Age of Onset   Diabetes Mother    Heart disease Father    SOCIAL HISTORY Social History   Tobacco Use   Smoking status: Never   Smokeless tobacco: Never  Vaping Use   Vaping Use: Never used  Substance Use Topics   Alcohol use: Yes   Drug use: Not Currently       OPHTHALMIC EXAM:  Base Eye Exam     Visual Acuity (Snellen - Linear)       Right Left   Dist [place] 20/80 -2 20/25 -1   Dist ph [place] NI          Tonometry (Tonopen, 7:58 AM)       Right Left   Pressure 12 18         Pupils       Pupils Dark Light Shape React APD   Right PERRL 3 2 Round Brisk None   Left PERRL 3 2 Round Brisk None         Visual Fields (Counting fingers)       Left Right    Full Full         Extraocular Movement       Right Left    Full, Ortho Full, Ortho         Neuro/Psych     Oriented x3: Yes   Mood/Affect: Normal         Dilation     Both eyes: 1.0% Mydriacyl, 2.5% Phenylephrine @ 7:58 AM           Slit Lamp and Fundus Exam     Slit Lamp Exam       Right Left   Lids/Lashes Normal Dermatochalasis - upper lid   Conjunctiva/Sclera Mild subconjunctival hemorrhage ST, mild residual STK ST quad White and quiet   Cornea 3+ Punctate epithelial erosions, well healed cataract wound Trace tear film debris   Anterior Chamber deep, 0.5+cell/pigment deep, clear   Iris Round and moderately dilated, focal atrophy at 1030 Round and dilated   Lens PC IOL in good position  with open PC 2+ Nuclear sclerosis, 2+ Cortical cataract   Anterior Vitreous post vitrectomy, clear mild syneresis         Fundus Exam       Right Left   Disc Compact, mild Pallor, Sharp rim Pink and Sharp   C/D Ratio 0.1 0.1   Macula Flat, blunted foveal reflex, mild ERM, cystic changes centrally -- slightly increased, No heme Flat, Good foveal reflex, No heme or edema   Vessels mild attenuation, mild  tortuosity attenuated, mild tortuosity   Periphery Retina attached over buckle, good buckle height, good laser changes over buckle, retinal cyst at 0630; ORIGINALLY: bullous RD from 0600-1230 with retinal tears at 1030, 1100, 1200 and 0700 Attached, pigment lattice from 0500-0600 with retinal holes at 0500 and 0600 with shallow SRF, focal lattice at 0730, pigmented lattice at 0100 and 0130 - no SRF; VR tufts at 1030 and 11; all lesion with good laser changes surrounding, no new RT/RD or lattice           IMAGING AND PROCEDURES  Imaging and Procedures for 08/10/2022  OCT, Retina - OU - Both Eyes       Right Eye Quality was good. Central Foveal Thickness: 383. Progression has worsened. Findings include no IRF, abnormal foveal contour, epiretinal membrane, macular pucker, subretinal fluid, outer retinal atrophy (retina reattached; +ERM with mild pucker and central thickening, mild interval increase in central cystic changes; no SRF, interval increase in inner retinal schisis inferior periphery).   Left Eye Quality was good. Central Foveal Thickness: 308. Progression has been stable. Findings include normal foveal contour, no IRF, no SRF, vitreomacular adhesion .   Notes *Images captured and stored on drive  Diagnosis / Impression:  OD: retina reattached; +ERM with mild pucker and central thickening, mild interval increase in central cystic changes; no SRF, interval increase in inner retinal schisis inferior periphery OS: NFP, no IRF/SRF  Clinical management:  See  below  Abbreviations: NFP - Normal foveal profile. CME - cystoid macular edema. PED - pigment epithelial detachment. IRF - intraretinal fluid. SRF - subretinal fluid. EZ - ellipsoid zone. ERM - epiretinal membrane. ORA - outer retinal atrophy. ORT - outer retinal tubulation. SRHM - subretinal hyper-reflective material. IRHM - intraretinal hyper-reflective material      Injection into Tenon's Capsule - OD - Right Eye       Time Out 08/10/2022. 8:21 AM. Confirmed correct patient, procedure, site, and patient consented.   Anesthesia Topical anesthesia was used. Anesthetic medications included Lidocaine 2%, Proparacaine 0.5%.   Procedure Preparation included 5% betadine to ocular surface. A (25g) needle was used.   Injection: 40 mg triamcinolone acetonide 40 MG/ML   Route: Other, Site: Right Eye   NDC: [phone], Lot: [id number], Expiration date: 10/16/2023   Post-op Post injection exam found visual acuity of at least counting fingers. The patient tolerated the procedure well. There were no complications. The patient received written and verbal post procedure care education. Post injection medications included ocuflox.   Notes 1 cc of Kenalog-40 (40 mg) injected into subtenon's capsule in the superotemporal quadrant. Betadine was applied to Injection area pre and post-injection then rinsed with sterile BSS. 1 drop of polymixin was instilled into the eye. There were no complications. Pt tolerated procedure well.            ASSESSMENT/PLAN:    ICD-10-CM   1. 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 Retinal hole of both eyes  H33.323     4. 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 Pseudophakia  Z96.1     6. Cystoid macular edema of right eye  H35.351 OCT, Retina - OU - Both Eyes    Injection into Tenon's Capsule - OD - Right Eye    triamcinolone acetonide (KENALOG-40)  injection 40 mg  Rhegmatogenous retinal detachment, OD - bullous temporal mac off detachment, onset of foveal involvement 6.14.23 by pt history -- maybe earlier - detached temporally from 0600-1230 oclock, fovea off, +corrugations - 4 tears within detachment -- 0730, 1030, 1100, and 1200; +lattice degen within detachment also - s/p SBP + PPV/PFO/EL/FAX/14% C3F8 OD, 06.29.23             - doing well             - retina attached, good buckle height and laser around breaks             - IOP 12 today  - AT's prn  - restarted PF v Loteprednol and Prolensa QID OD for CME  - f/u 6 weeks, DFE OU  2,3. Lattice degeneration w/ atrophic holes, left eye - pigmented lattice from 0500-0600 with retinal holes at 0500 and 0600 with shallow SRF; focal lattice at 0730, pigmented lattice from 0100 and 0130 - no SRF - s/p laser retinopexy OS (06.19.23) - s/p touch up laser in OR (06.29.23) -- good laser changes around 1030 and 1100 VR tufts - monitor  4. Mixed Cataract OS - The symptoms of cataract, surgical options, and treatments and risks were discussed with patient. - discussed diagnosis and progression - not yet visually significant - monitor  5. Pseudophakia OD  - s/p CE/IOL OD (Dr. Richardson Landry at [facility], 11.14.23)  - IOL in good position, doing well  - monitor  6. CME OD  - s/p STK OD #1 (04.09.24)  - mild persistent [place] OD -- slightly worse today  - OCT shows interval increase in CME  - pt currently on Loteprednol TID OD and Prolensa TID and serum tears QID OD  - recommend STK OD #2 today, 06.25.24  - pt wishes to proceed with injection  - RBA of procedure discussed, questions answered - see procedure note - STK informed consent obtained and signed, 04.09.24 (OD)  - f/u 6 weeks, sooner prn DFE, OCT  Ophthalmic Meds Ordered this visit:  Meds ordered this encounter  Medications   triamcinolone acetonide (KENALOG-40) injection 40 mg     Return in about 6 weeks (around  09/21/2022) for f/u CME OD, DFE, OCT.  There are no Patient Instructions on file for this visit.  This document serves as a record of services personally performed by Karie Chimera, MD, PhD. It was created on their behalf by Gerilyn Nestle, COT an ophthalmic technician. The creation of this record is the provider's dictation and/or activities during the visit.    Electronically signed by:  Gerilyn Nestle, COT  6.11.24 11:30 AM  This document serves as a record of services personally performed by Karie Chimera, MD, PhD. It was created on their behalf by Glee Arvin. Manson Passey, OA an ophthalmic technician. The creation of this record is the provider's dictation and/or activities during the visit.    Electronically signed by: Glee Arvin. Manson Passey, [place] 06.25.2024 11:30 AM  Karie Chimera, M.D., Ph.D. Diseases & Surgery of the Retina and Vitreous [facility]  I have reviewed the above documentation for accuracy and completeness, and I agree with the above. Karie Chimera, M.D., Ph.D. 08/10/22 11:30 AM  Abbreviations: M myopia (nearsighted); A astigmatism; H hyperopia (farsighted); P presbyopia; Mrx spectacle prescription;  CTL contact lenses; OD right eye; OS left eye; OU both eyes  XT exotropia; ET esotropia; PEK punctate epithelial keratitis; PEE punctate epithelial erosions; DES dry eye syndrome; MGD meibomian gland  dysfunction; ATs artificial tears; PFAT's preservative free artificial tears; NSC nuclear sclerotic cataract; PSC posterior subcapsular cataract; ERM epi-retinal membrane; PVD posterior vitreous detachment; RD retinal detachment; DM diabetes mellitus; DR diabetic retinopathy; NPDR non-proliferative diabetic retinopathy; PDR proliferative diabetic retinopathy; CSME clinically significant macular edema; DME diabetic macular edema; dbh dot blot hemorrhages; CWS cotton wool spot; POAG primary open angle glaucoma; C/D cup-to-disc ratio; HVF humphrey visual field; GVF  goldmann visual field; OCT optical coherence tomography; IOP intraocular pressure; BRVO Branch retinal vein occlusion; CRVO central retinal vein occlusion; CRAO central retinal artery occlusion; BRAO branch retinal artery occlusion; RT retinal tear; SB scleral buckle; PPV pars plana vitrectomy; VH Vitreous hemorrhage; PRP panretinal laser photocoagulation; IVK intravitreal kenalog; VMT vitreomacular traction; MH Macular hole;  NVD neovascularization of the disc; NVE neovascularization elsewhere; AREDS age related eye disease study; ARMD age related macular degeneration; POAG primary open angle glaucoma; EBMD epithelial/anterior basement membrane dystrophy; ACIOL anterior chamber intraocular lens; IOL intraocular lens; PCIOL posterior chamber intraocular lens; Phaco/IOL phacoemulsification with intraocular lens placement; PRK photorefractive keratectomy; LASIK laser assisted in situ keratomileusis; HTN hypertension; DM diabetes mellitus; COPD chronic obstructive pulmonary disease

## 2022-08-06 ENCOUNTER — Encounter (INDEPENDENT_AMBULATORY_CARE_PROVIDER_SITE_OTHER): Payer: 59 | Admitting: Ophthalmology

## 2022-08-10 ENCOUNTER — Encounter (INDEPENDENT_AMBULATORY_CARE_PROVIDER_SITE_OTHER): Payer: Self-pay | Admitting: Ophthalmology

## 2022-08-10 ENCOUNTER — Ambulatory Visit (INDEPENDENT_AMBULATORY_CARE_PROVIDER_SITE_OTHER): Payer: 59 | Admitting: Ophthalmology

## 2022-08-10 DIAGNOSIS — H25812 Combined forms of age-related cataract, left eye: Secondary | ICD-10-CM

## 2022-08-10 DIAGNOSIS — Z961 Presence of intraocular lens: Secondary | ICD-10-CM | POA: Diagnosis not present

## 2022-08-10 DIAGNOSIS — H35412 Lattice degeneration of retina, left eye: Secondary | ICD-10-CM

## 2022-08-10 DIAGNOSIS — H35351 Cystoid macular degeneration, right eye: Secondary | ICD-10-CM

## 2022-08-10 DIAGNOSIS — H3321 Serous retinal detachment, right eye: Secondary | ICD-10-CM | POA: Diagnosis not present

## 2022-08-10 DIAGNOSIS — H33323 Round hole, bilateral: Secondary | ICD-10-CM

## 2022-08-10 MED ORDER — TRIAMCINOLONE ACETONIDE 40 MG/ML IJ SUSP FOR KALEIDOSCOPE
40.0000 mg | INTRAMUSCULAR | Status: AC | PRN
  Administered 2022-08-10: 40 mg

## 2022-09-08 NOTE — Progress Notes (Signed)
[facility] - Clinic Note  09/21/2022     CHIEF COMPLAINT Patient presents for Retina Follow Up   HISTORY OF PRESENT ILLNESS: Isabella Morales is a 52 y.o. female who presents to the clinic today for:   HPI     Retina Follow Up   Patient presents with  Other.  In right eye.  This started 6 weeks ago.  I, the attending physician,  performed the HPI with the patient and updated documentation appropriately.        Comments   Patient here for 6 weeks retina follow up for CME OD. Patient states vision about the same. No eye pain. Just general discomfort- driness. Using all drops for OD.      Last edited by Rennis Chris, MD on 09/21/2022  4:46 PM.    Pt states she is taking Prolensa and Lotemax TID OD, Restasis BID OU, serum tears QID OD and a nose spray for dry eyes BID, she states she has been seen at [facility] since she was here last and they said everything is the same   Referring physician: No referring provider defined for this encounter.  HISTORICAL INFORMATION:   Selected notes from the MEDICAL RECORD NUMBER Referred by [facility] for mac off RD LEE:  Ocular Hx- PMH-    CURRENT MEDICATIONS: Current Outpatient Medications (Ophthalmic Drugs)  Medication Sig   Bromfenac Sodium (PROLENSA) 0.07 % SOLN Place 1 drop into the right eye in the morning, at noon, and at bedtime.   cycloSPORINE (RESTASIS) 0.05 % ophthalmic emulsion Place 1 drop into the right eye 2 (two) times daily.   ketorolac (ACULAR) 0.5 % ophthalmic solution Place 1 drop into the right eye 4 (four) times daily.   prednisoLONE acetate (PRED FORTE) 1 % ophthalmic suspension Place 1 drop into the right eye 3 (three) times daily.   Varenicline Tartrate (TYRVAYA) 0.03 MG/ACT SOLN Place into the nose.   bacitracin-polymyxin b (POLYSPORIN) ophthalmic ointment Place into the right eye 4 (four) times daily. Place a 1/2 inch ribbon of ointment into the lower eyelid. (Patient not taking: Reported on  11/24/2021)   brimonidine (ALPHAGAN) 0.2 % ophthalmic solution Place 1 drop into the right eye 2 (two) times daily. (Patient not taking: Reported on 11/24/2021)   No current facility-administered medications for this visit. (Ophthalmic Drugs)   Current Outpatient Medications (Other)  Medication Sig   CALCIUM PO Take 2 tablets by mouth daily. Gummie (Patient not taking: Reported on 01/28/2022)   No current facility-administered medications for this visit. (Other)   REVIEW OF SYSTEMS: ROS   Positive for: Eyes Negative for: Constitutional, Gastrointestinal, Neurological, Skin, Genitourinary, Musculoskeletal, HENT, Endocrine, Cardiovascular, Respiratory, Psychiatric, Allergic/Imm, Heme/Lymph Last edited by Laddie Aquas, COA on 09/21/2022  7:48 AM.     ALLERGIES No Known Allergies  PAST MEDICAL HISTORY Past Medical History:  Diagnosis Date   Medical history non-contributory    Past Surgical History:  Procedure Laterality Date   CATARACT EXTRACTION Right    Dr. Richardson Landry @ [facility].   EYE EXAMINATION UNDER ANESTHESIA Left 08/13/2021   Procedure: LEFT EYE EXAM UNDER ANESTHESIA WITH LASER INDIRECT OPHTHALMOSCOPY;  Surgeon: Rennis Chris, MD;  Location: [facility] OR;  Service: Ophthalmology;  Laterality: Left;   GAS INSERTION Right 08/13/2021   Procedure: INSERTION OF GAS;  Surgeon: Rennis Chris, MD;  Location: [facility] OR;  Service: Ophthalmology;  Laterality: Right;   GAS/FLUID EXCHANGE Right 08/13/2021   Procedure: GAS/FLUID EXCHANGE;  Surgeon: Rennis Chris, MD;  Location: [facility] OR;  Service: Ophthalmology;  Laterality: Right;   LASER PHOTO ABLATION Right 08/13/2021   Procedure: LASER PHOTO ABLATION;  Surgeon: Rennis Chris, MD;  Location: [facility] OR;  Service: Ophthalmology;  Laterality: Right;   PERFLUORONE INJECTION Right 08/13/2021   Procedure: PERFLUORONE INJECTION;  Surgeon: Rennis Chris, MD;  Location: [facility] OR;  Service: Ophthalmology;  Laterality: Right;   SCLERAL BUCKLE Right 08/13/2021    Procedure: SCLERAL BUCKLE PROCEDURE IN RIGHT EYE;  Surgeon: Rennis Chris, MD;  Location: [facility] OR;  Service: Ophthalmology;  Laterality: Right;   VITRECTOMY 25 GAUGE WITH SCLERAL BUCKLE Right 08/13/2021   Procedure: VITRECTOMY 25 GAUGE;  Surgeon: Rennis Chris, MD;  Location: [facility] OR;  Service: Ophthalmology;  Laterality: Right;   FAMILY HISTORY Family History  Problem Relation Age of Onset   Diabetes Mother    Heart disease Father    SOCIAL HISTORY Social History   Tobacco Use   Smoking status: Never   Smokeless tobacco: Never  Vaping Use   Vaping status: Never Used  Substance Use Topics   Alcohol use: Yes   Drug use: Not Currently       OPHTHALMIC EXAM:  Base Eye Exam     Visual Acuity (Snellen - Linear)       Right Left   Dist [place] 20/80 -2 20/20   Dist ph [place] NI          Tonometry (Tonopen, 7:46 AM)       Right Left   Pressure 19 22         Pupils       Dark Light Shape React APD   Right 3 2 Round Brisk None   Left 3 2 Round Brisk None         Visual Fields (Counting fingers)       Left Right    Full Full         Extraocular Movement       Right Left    Full, Ortho Full, Ortho         Neuro/Psych     Oriented x3: Yes   Mood/Affect: Normal         Dilation     Both eyes: 1.0% Mydriacyl, 2.5% Phenylephrine @ 7:46 AM           Slit Lamp and Fundus Exam     Slit Lamp Exam       Right Left   Lids/Lashes Dermatochalasis - upper lid Dermatochalasis - upper lid   Conjunctiva/Sclera White and quiet White and quiet   Cornea 1+ Punctate epithelial erosions, well healed cataract wound Trace tear film debris   Anterior Chamber deep, 0.5+cell deep, clear   Iris Round and dilated, focal atrophy at 1030 Round and dilated   Lens PC IOL in good position with open PC 2+ Nuclear sclerosis, 2+ Cortical cataract   Anterior Vitreous post vitrectomy, clear mild syneresis         Fundus Exam       Right Left   Disc Compact, mild Pallor,  Sharp rim Pink and Sharp   C/D Ratio 0.1 0.1   Macula Flat, blunted foveal reflex, +ERM, cystic changes centrally -- persistent, No heme Flat, Good foveal reflex, No heme or edema   Vessels attenuated, mild tortuosity attenuated, mild tortuosity   Periphery Retina attached over buckle, good buckle height, good laser changes over buckle, retinal cyst at 0630; ORIGINALLY: bullous RD from 3086-5784 with retinal tears at 1030, 1100, 1200 and  0700 Attached, pigment lattice from 0500-0600 with retinal holes at 0500 and 0600 with shallow SRF, focal lattice at 0730, pigmented lattice at 0100 and 0130 - no SRF; VR tufts at 1030 and 11; all lesion with good laser changes surrounding, no new RT/RD or lattice           IMAGING AND PROCEDURES  Imaging and Procedures for 09/21/2022  OCT, Retina - OU - Both Eyes       Right Eye Quality was good. Central Foveal Thickness: 413. Progression has worsened. 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   Left Eye Quality was good. Central Foveal Thickness: 311. Progression has been stable. Findings include normal foveal contour, no IRF, no SRF, vitreomacular adhesion .   Notes *Images captured and stored on drive  Diagnosis / Impression:  OD: retina reattached; +ERM with mild pucker and central thickening, persistent central cystic changes; interval increase in cystic changes temporal macula and inferior periphery -- multilaminer schisis, no SRF OS: NFP, no IRF/SRF  Clinical management:  See below  Abbreviations: NFP - Normal foveal profile. CME - cystoid macular edema. PED - pigment epithelial detachment. IRF - intraretinal fluid. SRF - subretinal fluid. EZ - ellipsoid  zone. ERM - epiretinal membrane. ORA - outer retinal atrophy. ORT - outer retinal tubulation. SRHM - subretinal hyper-reflective material. IRHM - intraretinal hyper-reflective material            ASSESSMENT/PLAN:    ICD-10-CM   1. 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 Retinal hole of both eyes  H33.323     4. 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 Pseudophakia  Z96.1     6. 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 Lattice degeneration w/ atrophic holes, left eye - pigmented lattice from 0500-0600 with retinal holes at 0500 and 0600 with shallow SRF; focal lattice at 0730, pigmented lattice from 0100 and 0130 - no SRF - s/p laser retinopexy OS (06.19.23) - s/p touch up laser in OR (06.29.23) -- good laser changes around 1030 and 1100 VR tufts - monitor  4. Mixed Cataract OS - The symptoms of cataract, surgical options, and treatments and risks were discussed with patient. - discussed diagnosis and progression - not yet visually significant - monitor  5. Pseudophakia OD  -  s/p CE/IOL OD (Dr. Richardson [facility]ry at [facility], 11.14.23)  - IOL in good position, doing well  - monitor  6. 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    Return for f/u 2-3 months, CME OD, DFE, OCT.  There are no Patient Instructions on file for this visit.  This document serves as a record of services personally performed by Karie Chimera, MD, PhD. It was created on their behalf by Gerilyn Nestle, COT an ophthalmic technician. The creation of this record is the provider's dictation and/or activities during the visit.    Electronically signed by:  Charlette Caffey, COT  09/21/22 4:46 PM  This document serves as a record of services personally performed by Karie Chimera, MD, PhD. It was created on their behalf by Glee Arvin. Manson Passey, OA an ophthalmic technician. The creation of this record is the provider's dictation and/or activities during the visit.    Electronically signed by: Glee Arvin. Manson Passey, OA 09/21/22 4:46 PM  Karie Chimera, M.D., Ph.D. Diseases & Surgery of the Retina and Vitreous [facility] 09/21/2022   I have reviewed the above documentation for accuracy and completeness, and I agree with the above. Karie Chimera, M.D., Ph.D. 09/21/22 4:48 PM    Karie Chimera, M.D., Ph.D. Diseases & Surgery of the Retina and Vitreous [facility] Abbreviations: M myopia (nearsighted); A astigmatism; H hyperopia (farsighted); P presbyopia; Mrx spectacle prescription;  CTL contact lenses; OD right eye; OS left eye; OU both eyes  XT exotropia; ET esotropia; PEK punctate epithelial keratitis; PEE punctate epithelial erosions; DES dry eye syndrome;  MGD meibomian gland dysfunction; ATs artificial tears; PFAT's preservative free artificial tears; NSC nuclear sclerotic cataract; PSC posterior subcapsular cataract; ERM epi-retinal membrane; PVD posterior vitreous detachment; RD retinal detachment; DM diabetes mellitus; DR diabetic retinopathy; NPDR non-proliferative diabetic retinopathy; PDR proliferative diabetic retinopathy; CSME clinically significant macular edema; DME diabetic macular edema; dbh dot blot hemorrhages; CWS cotton wool spot; POAG primary open angle glaucoma; C/D cup-to-disc ratio; HVF humphrey visual field; GVF goldmann visual field; OCT optical coherence tomography; IOP intraocular pressure; BRVO Branch retinal vein occlusion; CRVO central retinal vein occlusion; CRAO central retinal artery occlusion; BRAO branch retinal artery occlusion; RT retinal tear; SB scleral buckle; PPV pars plana vitrectomy; VH Vitreous hemorrhage; PRP panretinal laser photocoagulation; IVK intravitreal kenalog; VMT vitreomacular traction; MH Macular hole;  NVD neovascularization of the disc; NVE neovascularization elsewhere; AREDS age related eye disease study; ARMD age related macular degeneration; POAG primary open angle glaucoma; EBMD epithelial/anterior basement membrane dystrophy; ACIOL anterior chamber intraocular lens; IOL intraocular lens; PCIOL posterior chamber intraocular lens; Phaco/IOL phacoemulsification with intraocular lens placement; PRK photorefractive keratectomy; LASIK laser assisted in situ keratomileusis; HTN hypertension; DM diabetes mellitus; COPD chronic obstructive pulmonary disease

## 2022-09-21 ENCOUNTER — Ambulatory Visit (INDEPENDENT_AMBULATORY_CARE_PROVIDER_SITE_OTHER): Payer: 59 | Admitting: Ophthalmology

## 2022-09-21 ENCOUNTER — Encounter (INDEPENDENT_AMBULATORY_CARE_PROVIDER_SITE_OTHER): Payer: Self-pay | Admitting: Ophthalmology

## 2022-09-21 DIAGNOSIS — H35351 Cystoid macular degeneration, right eye: Secondary | ICD-10-CM

## 2022-09-21 DIAGNOSIS — H3321 Serous retinal detachment, right eye: Secondary | ICD-10-CM

## 2022-09-21 DIAGNOSIS — H33323 Round hole, bilateral: Secondary | ICD-10-CM

## 2022-09-21 DIAGNOSIS — H25812 Combined forms of age-related cataract, left eye: Secondary | ICD-10-CM

## 2022-09-21 DIAGNOSIS — H35412 Lattice degeneration of retina, left eye: Secondary | ICD-10-CM

## 2022-09-21 DIAGNOSIS — Z961 Presence of intraocular lens: Secondary | ICD-10-CM

## 2022-11-24 ENCOUNTER — Encounter (INDEPENDENT_AMBULATORY_CARE_PROVIDER_SITE_OTHER): Payer: 59 | Admitting: Ophthalmology

## 2022-11-24 DIAGNOSIS — H3321 Serous retinal detachment, right eye: Secondary | ICD-10-CM

## 2022-11-24 DIAGNOSIS — H35351 Cystoid macular degeneration, right eye: Secondary | ICD-10-CM

## 2022-11-24 DIAGNOSIS — H35412 Lattice degeneration of retina, left eye: Secondary | ICD-10-CM

## 2022-11-24 DIAGNOSIS — Z961 Presence of intraocular lens: Secondary | ICD-10-CM

## 2022-11-24 DIAGNOSIS — H33323 Round hole, bilateral: Secondary | ICD-10-CM

## 2022-11-24 DIAGNOSIS — H25812 Combined forms of age-related cataract, left eye: Secondary | ICD-10-CM

## 2022-11-25 NOTE — Progress Notes (Signed)
[facility] - Clinic Note  11/26/2022     CHIEF COMPLAINT Patient presents for Retina Follow Up   HISTORY OF PRESENT ILLNESS: Isabella Morales is a 52 y.o. female who presents to the clinic today for:   HPI     Retina Follow Up   Patient presents with  Other.  In right eye.  This started 2 months ago.  I, the attending physician,  performed the HPI with the patient and updated documentation appropriately.        Comments   Patient here for 2 months retina follow up for CME OD. Patient states vision about the same. No eye pain.       Last edited by Rennis Chris, MD on 11/27/2022  8:25 PM.    Pt states vision is the same   Referring physician: No referring provider defined for this encounter.  HISTORICAL INFORMATION:   Selected notes from the MEDICAL RECORD NUMBER Referred by [facility] for mac off RD LEE:  Ocular Hx- PMH-    CURRENT MEDICATIONS: Current Outpatient Medications (Ophthalmic Drugs)  Medication Sig   Bromfenac Sodium (PROLENSA) 0.07 % SOLN Place 1 drop into the right eye in the morning, at noon, and at bedtime.   cycloSPORINE (RESTASIS) 0.05 % ophthalmic emulsion Place 1 drop into the right eye 2 (two) times daily.   ketorolac (ACULAR) 0.5 % ophthalmic solution Place 1 drop into the right eye 4 (four) times daily.   prednisoLONE acetate (PRED FORTE) 1 % ophthalmic suspension Place 1 drop into the right eye 3 (three) times daily.   Varenicline Tartrate (TYRVAYA) 0.03 MG/ACT SOLN Place into the nose.   bacitracin-polymyxin b (POLYSPORIN) ophthalmic ointment Place into the right eye 4 (four) times daily. Place a 1/2 inch ribbon of ointment into the lower eyelid. (Patient not taking: Reported on 11/24/2021)   brimonidine (ALPHAGAN) 0.2 % ophthalmic solution Place 1 drop into the right eye 2 (two) times daily. (Patient not taking: Reported on 11/24/2021)   No current facility-administered medications for this visit. (Ophthalmic  Drugs)   Current Outpatient Medications (Other)  Medication Sig   CALCIUM PO Take 2 tablets by mouth daily. Gummie (Patient not taking: Reported on 01/28/2022)   No current facility-administered medications for this visit. (Other)   REVIEW OF SYSTEMS: ROS   Positive for: Eyes Negative for: Constitutional, Gastrointestinal, Neurological, Skin, Genitourinary, Musculoskeletal, HENT, Endocrine, Cardiovascular, Respiratory, Psychiatric, Allergic/Imm, Heme/Lymph Last edited by Laddie Aquas, COA on 11/26/2022  1:25 PM.      ALLERGIES No Known Allergies  PAST MEDICAL HISTORY Past Medical History:  Diagnosis Date   Medical history non-contributory    Past Surgical History:  Procedure Laterality Date   CATARACT EXTRACTION Right    Dr. Richardson Landry @ [facility].   EYE EXAMINATION UNDER ANESTHESIA Left 08/13/2021   Procedure: LEFT EYE EXAM UNDER ANESTHESIA WITH LASER INDIRECT OPHTHALMOSCOPY;  Surgeon: Rennis Chris, MD;  Location: [facility] OR;  Service: Ophthalmology;  Laterality: Left;   GAS INSERTION Right 08/13/2021   Procedure: INSERTION OF GAS;  Surgeon: Rennis Chris, MD;  Location: [facility] OR;  Service: Ophthalmology;  Laterality: Right;   GAS/FLUID EXCHANGE Right 08/13/2021   Procedure: GAS/FLUID EXCHANGE;  Surgeon: Rennis Chris, MD;  Location: [facility] OR;  Service: Ophthalmology;  Laterality: Right;   LASER PHOTO ABLATION Right 08/13/2021   Procedure: LASER PHOTO ABLATION;  Surgeon: Rennis Chris, MD;  Location: [facility] OR;  Service: Ophthalmology;  Laterality: Right;   PERFLUORONE INJECTION Right 08/13/2021  Procedure: PERFLUORONE INJECTION;  Surgeon: Rennis Chris, MD;  Location: [facility] OR;  Service: Ophthalmology;  Laterality: Right;   SCLERAL BUCKLE Right 08/13/2021   Procedure: SCLERAL BUCKLE PROCEDURE IN RIGHT EYE;  Surgeon: Rennis Chris, MD;  Location: [facility] OR;  Service: Ophthalmology;  Laterality: Right;   VITRECTOMY 25 GAUGE WITH SCLERAL BUCKLE Right 08/13/2021   Procedure: VITRECTOMY 25 GAUGE;   Surgeon: Rennis Chris, MD;  Location: [facility] OR;  Service: Ophthalmology;  Laterality: Right;   FAMILY HISTORY Family History  Problem Relation Age of Onset   Diabetes Mother    Heart disease Father    SOCIAL HISTORY Social History   Tobacco Use   Smoking status: Never   Smokeless tobacco: Never  Vaping Use   Vaping status: Never Used  Substance Use Topics   Alcohol use: Yes   Drug use: Not Currently       OPHTHALMIC EXAM:  Base Eye Exam     Visual Acuity (Snellen - Linear)       Right Left   Dist [place] 20/70 -2 20/20         Tonometry (Tonopen, 1:23 PM)       Right Left   Pressure 16 19         Pupils       Dark Light Shape React APD   Right 3 2 Round Brisk None   Left 3 2 Round Brisk None         Visual Fields (Counting fingers)       Left Right    Full Full         Extraocular Movement       Right Left    Full, Ortho Full, Ortho         Neuro/Psych     Oriented x3: Yes   Mood/Affect: Normal         Dilation     Both eyes: 1.0% Mydriacyl, 2.5% Phenylephrine @ 1:23 PM           Slit Lamp and Fundus Exam     Slit Lamp Exam       Right Left   Lids/Lashes Dermatochalasis - upper lid Dermatochalasis - upper lid   Conjunctiva/Sclera White and quiet White and quiet   Cornea 1+ Punctate epithelial erosions, well healed cataract wound, mild endo pigment Trace tear film debris   Anterior Chamber deep and clear deep, clear   Iris Round and dilated, focal atrophy at 1030 Round and dilated   Lens PC IOL in good position with open PC 2+ Nuclear sclerosis, 2+ Cortical cataract   Anterior Vitreous post vitrectomy, clear mild syneresis         Fundus Exam       Right Left   Disc Compact, mild Pallor, Sharp rim Pink and Sharp   C/D Ratio 0.1 0.1   Macula Flat, blunted foveal reflex, +ERM, cystic changes centrally -- slightly increased, No heme Flat, Good foveal reflex, No heme or edema   Vessels attenuated, Tortuous attenuated, mild  tortuosity   Periphery Retina attached over buckle, good buckle height, good laser changes over buckle, retinal cyst at 0630; ORIGINALLY: bullous RD from 4098-1191 with retinal tears at 1030, 1100, 1200 and 0700 Attached, pigment lattice from 0500-0600 with retinal holes at 0500 and 0600 with shallow SRF, focal lattice at 0730, pigmented lattice at 0100 and 0130 - no SRF; VR tufts at 1030 and 11; all lesion with good laser changes surrounding, no new RT/RD or lattice  IMAGING AND PROCEDURES  Imaging and Procedures for 11/26/2022  OCT, Retina - OU - Both Eyes       Right Eye Quality was good. Central Foveal Thickness: 476. Progression has worsened. Findings include no SRF, abnormal foveal contour, epiretinal membrane, intraretinal fluid, macular pucker, outer retinal atrophy (retina reattached; +ERM with mild pucker and central thickening, persistent central cystic changes -- increased, interval increase in cystic changes temporal macula and inferior periphery -- multilaminer schisis, no SRF).   Left Eye Quality was good. Central Foveal Thickness: 311. Progression has been stable. Findings include normal foveal contour, no IRF, no SRF, vitreomacular adhesion .   Notes *Images captured and stored on drive  Diagnosis / Impression:  OD: retina reattached; +ERM with mild pucker and central thickening, persistent central cystic changes -- increased, interval increase in cystic changes temporal macula and inferior periphery -- multilaminer schisis, no SRF OS: NFP, no IRF/SRF  Clinical management:  See below  Abbreviations: NFP - Normal foveal profile. CME - cystoid macular edema. PED - pigment epithelial detachment. IRF - intraretinal fluid. SRF - subretinal fluid. EZ - ellipsoid zone. ERM - epiretinal membrane. ORA - outer retinal atrophy. ORT - outer retinal tubulation. SRHM - subretinal hyper-reflective material. IRHM - intraretinal hyper-reflective material             ASSESSMENT/PLAN:    ICD-10-CM   1. 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 Retinal hole of both eyes  H33.323     4. 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 Pseudophakia  Z96.1     6. Cystoid macular edema of right eye  H35.351     7. Epiretinal membrane (ERM) of right eye  H35.371 OCT, Retina - OU - Both Eyes      Rhegmatogenous retinal detachment, OD - bullous temporal mac off detachment, onset of foveal involvement 6.14.23 by pt history -- maybe earlier - detached temporally from 0600-1230 oclock, fovea off, +corrugations - 4 tears within detachment -- 0730, 1030, 1100, and 1200; +lattice degen within detachment also - s/p SBP + PPV/PFO/EL/FAX/14% C3F8 OD, 06.29.23             - doing well             - retina attached, good buckle height and laser around breaks             - IOP 16 today  - AT's prn - pt currently on Loteprednol TID OD and Prolensa TID OD -- stop Prolensa, cont Loteprednol TID OD  - f/u 2 months, DFE OU  2,3. Lattice degeneration w/ atrophic holes, left eye - pigmented lattice from 0500-0600 with retinal holes at 0500 and 0600 with shallow SRF; focal lattice at 0730, pigmented lattice from 0100 and 0130 - no SRF - s/p laser retinopexy OS (06.19.23) - s/p touch up laser in OR (06.29.23) -- good laser changes around 1030 and 1100 VR tufts - monitor  4. Mixed Cataract OS - The symptoms of cataract, surgical options, and treatments and risks were discussed with patient. - discussed diagnosis and progression - not yet visually significant - monitor  5. Pseudophakia OD  - s/p CE/IOL OD (Dr. Richardson Landry at [facility] 11.14.23)  - IOL in good position, doing well  - monitor  6. CME OD  - s/p STK OD #1 (04.09.24), #2 (  06.25.24) -- minimal improvement w/ STKs  - mild persistent [place] OD -- slightly worse today  - OCT shows interval increase in CME /  inferior retinoschisis  - pt currently on Loteprednol QID OD and Prolensa QID OD -- stop Prolensa, decrease Loteprednol TID - STK informed consent obtained and signed, 04.09.24 (OD)  - f/u 2 months, sooner prn DFE, OCT  7. ERM w/ retinoschisis  - cystic changes may be progressive schisis  - BCVA 20/70 from 20/80 despite worsening cystic changes  - discussed ERM treatments  - recommend monitoring  Ophthalmic Meds Ordered this visit:  No orders of the defined types were placed in this encounter.    Return in about 2 months (around 01/26/2023) for f/u CME OD, DFE, OCT.  There are no Patient Instructions on file for this visit.  This document serves as a record of services personally performed by Karie Chimera, MD, PhD. It was created on their behalf by Berlin Hun COT, an ophthalmic technician. The creation of this record is the provider's dictation and/or activities during the visit.    Electronically signed by: Berlin Hun COT 10.10.24 8:26 PM  This document serves as a record of services personally performed by Karie Chimera, MD, PhD. It was created on their behalf by Glee Arvin. Manson Passey, OA an ophthalmic technician. The creation of this record is the provider's dictation and/or activities during the visit.    Electronically signed by: Glee Arvin. Manson Passey, OA 11/27/22 8:26 PM  Karie Chimera, M.D., Ph.D. Diseases & Surgery of the Retina and Vitreous [facility]  I have reviewed the above documentation for accuracy and completeness, and I agree with the above. Karie Chimera, M.D., Ph.D. 11/27/22 8:31 PM   Abbreviations: M myopia (nearsighted); A astigmatism; H hyperopia (farsighted); P presbyopia; Mrx spectacle prescription;  CTL contact lenses; OD right eye; OS left eye; OU both eyes  XT exotropia; ET esotropia; PEK punctate epithelial keratitis; PEE punctate epithelial erosions; DES dry eye syndrome; MGD meibomian gland dysfunction; ATs  artificial tears; PFAT's preservative free artificial tears; NSC nuclear sclerotic cataract; PSC posterior subcapsular cataract; ERM epi-retinal membrane; PVD posterior vitreous detachment; RD retinal detachment; DM diabetes mellitus; DR diabetic retinopathy; NPDR non-proliferative diabetic retinopathy; PDR proliferative diabetic retinopathy; CSME clinically significant macular edema; DME diabetic macular edema; dbh dot blot hemorrhages; CWS cotton wool spot; POAG primary open angle glaucoma; C/D cup-to-disc ratio; HVF humphrey visual field; GVF goldmann visual field; OCT optical coherence tomography; IOP intraocular pressure; BRVO Branch retinal vein occlusion; CRVO central retinal vein occlusion; CRAO central retinal artery occlusion; BRAO branch retinal artery occlusion; RT retinal tear; SB scleral buckle; PPV pars plana vitrectomy; VH Vitreous hemorrhage; PRP panretinal laser photocoagulation; IVK intravitreal kenalog; VMT vitreomacular traction; MH Macular hole;  NVD neovascularization of the disc; NVE neovascularization elsewhere; AREDS age related eye disease study; ARMD age related macular degeneration; POAG primary open angle glaucoma; EBMD epithelial/anterior basement membrane dystrophy; ACIOL anterior chamber intraocular lens; IOL intraocular lens; PCIOL posterior chamber intraocular lens; Phaco/IOL phacoemulsification with intraocular lens placement; PRK photorefractive keratectomy; LASIK laser assisted in situ keratomileusis; HTN hypertension; DM diabetes mellitus; COPD chronic obstructive pulmonary disease

## 2022-11-26 ENCOUNTER — Encounter (INDEPENDENT_AMBULATORY_CARE_PROVIDER_SITE_OTHER): Payer: Self-pay | Admitting: Ophthalmology

## 2022-11-26 ENCOUNTER — Ambulatory Visit (INDEPENDENT_AMBULATORY_CARE_PROVIDER_SITE_OTHER): Payer: 59 | Admitting: Ophthalmology

## 2022-11-26 DIAGNOSIS — Z961 Presence of intraocular lens: Secondary | ICD-10-CM

## 2022-11-26 DIAGNOSIS — H3321 Serous retinal detachment, right eye: Secondary | ICD-10-CM | POA: Diagnosis not present

## 2022-11-26 DIAGNOSIS — H35351 Cystoid macular degeneration, right eye: Secondary | ICD-10-CM | POA: Diagnosis not present

## 2022-11-26 DIAGNOSIS — H35412 Lattice degeneration of retina, left eye: Secondary | ICD-10-CM

## 2022-11-26 DIAGNOSIS — H25812 Combined forms of age-related cataract, left eye: Secondary | ICD-10-CM | POA: Diagnosis not present

## 2022-11-26 DIAGNOSIS — H33323 Round hole, bilateral: Secondary | ICD-10-CM

## 2022-11-26 DIAGNOSIS — H35371 Puckering of macula, right eye: Secondary | ICD-10-CM | POA: Diagnosis not present

## 2022-11-27 ENCOUNTER — Encounter (INDEPENDENT_AMBULATORY_CARE_PROVIDER_SITE_OTHER): Payer: Self-pay | Admitting: Ophthalmology

## 2023-01-20 NOTE — Progress Notes (Signed)
[facility] - Clinic Note  01/21/2023     CHIEF COMPLAINT Patient presents for Retina Follow Up   HISTORY OF PRESENT ILLNESS: Isabella Morales is a 52 y.o. female who presents to the clinic today for:   HPI     Retina Follow Up   Patient presents with  Retinal Break/Detachment.  In right eye.  Severity is moderate.  Duration of 2 months.  I, the attending physician,  performed the HPI with the patient and updated documentation appropriately.        Comments   Patient states vision the same OU. Using lotemax tid OD, Restasis bid OD, and ophthalmic growth factor qid OD.      Last edited by Rennis Chris, MD on 01/22/2023 12:38 AM.    Pt states she is still very light sensitive, it has been going on for awhile and does not seem to be getting better, she is using lotemax TID OD, serum tears QID OD and Restasis BID OD   Referring physician: No referring provider defined for this encounter.  HISTORICAL INFORMATION:   Selected notes from the MEDICAL RECORD NUMBER Referred by [facility] for mac off RD LEE:  Ocular Hx- PMH-    CURRENT MEDICATIONS: Current Outpatient Medications (Ophthalmic Drugs)  Medication Sig   cycloSPORINE (RESTASIS) 0.05 % ophthalmic emulsion Place 1 drop into the right eye 2 (two) times daily.   loteprednol (LOTEMAX) 0.5 % ophthalmic suspension Place 1 drop into the right eye 3 (three) times daily.   bacitracin-polymyxin b (POLYSPORIN) ophthalmic ointment Place into the right eye 4 (four) times daily. Place a 1/2 inch ribbon of ointment into the lower eyelid. (Patient not taking: Reported on 11/24/2021)   brimonidine (ALPHAGAN) 0.2 % ophthalmic solution Place 1 drop into the right eye 2 (two) times daily. (Patient not taking: Reported on 11/24/2021)   Bromfenac Sodium (PROLENSA) 0.07 % SOLN Place 1 drop into the right eye in the morning, at noon, and at bedtime. (Patient not taking: Reported on 01/21/2023)   ketorolac (ACULAR) 0.5 %  ophthalmic solution Place 1 drop into the right eye 4 (four) times daily. (Patient not taking: Reported on 01/21/2023)   prednisoLONE acetate (PRED FORTE) 1 % ophthalmic suspension Place 1 drop into the right eye 3 (three) times daily. (Patient not taking: Reported on 01/21/2023)   Varenicline Tartrate (TYRVAYA) 0.03 MG/ACT SOLN Place into the nose. (Patient not taking: Reported on 01/21/2023)   No current facility-administered medications for this visit. (Ophthalmic Drugs)   Current Outpatient Medications (Other)  Medication Sig   CALCIUM PO Take 2 tablets by mouth daily. Gummie (Patient not taking: Reported on 01/28/2022)   No current facility-administered medications for this visit. (Other)   REVIEW OF SYSTEMS: ROS   Positive for: Eyes Negative for: Constitutional, Gastrointestinal, Neurological, Skin, Genitourinary, Musculoskeletal, HENT, Endocrine, Cardiovascular, Respiratory, Psychiatric, Allergic/Imm, Heme/Lymph Last edited by Annalee Genta D, COT on 01/21/2023  7:40 AM.       ALLERGIES No Known Allergies  PAST MEDICAL HISTORY Past Medical History:  Diagnosis Date   Medical history non-contributory    Past Surgical History:  Procedure Laterality Date   CATARACT EXTRACTION Right    Dr. Richardson Landry @ [facility].   EYE EXAMINATION UNDER ANESTHESIA Left 08/13/2021   Procedure: LEFT EYE EXAM UNDER ANESTHESIA WITH LASER INDIRECT OPHTHALMOSCOPY;  Surgeon: Rennis Chris, MD;  Location: [facility] OR;  Service: Ophthalmology;  Laterality: Left;   GAS INSERTION Right 08/13/2021   Procedure: INSERTION OF GAS;  Surgeon: Rennis Chris, MD;  Location: [facility] OR;  Service: Ophthalmology;  Laterality: Right;   GAS/FLUID EXCHANGE Right 08/13/2021   Procedure: GAS/FLUID EXCHANGE;  Surgeon: Rennis Chris, MD;  Location: [facility] OR;  Service: Ophthalmology;  Laterality: Right;   LASER PHOTO ABLATION Right 08/13/2021   Procedure: LASER PHOTO ABLATION;  Surgeon: Rennis Chris, MD;  Location: [facility] OR;  Service: Ophthalmology;   Laterality: Right;   PERFLUORONE INJECTION Right 08/13/2021   Procedure: PERFLUORONE INJECTION;  Surgeon: Rennis Chris, MD;  Location: [facility] OR;  Service: Ophthalmology;  Laterality: Right;   SCLERAL BUCKLE Right 08/13/2021   Procedure: SCLERAL BUCKLE PROCEDURE IN RIGHT EYE;  Surgeon: Rennis Chris, MD;  Location: [facility] OR;  Service: Ophthalmology;  Laterality: Right;   VITRECTOMY 25 GAUGE WITH SCLERAL BUCKLE Right 08/13/2021   Procedure: VITRECTOMY 25 GAUGE;  Surgeon: Rennis Chris, MD;  Location: [facility] OR;  Service: Ophthalmology;  Laterality: Right;   FAMILY HISTORY Family History  Problem Relation Age of Onset   Diabetes Mother    Heart disease Father    SOCIAL HISTORY Social History   Tobacco Use   Smoking status: Never   Smokeless tobacco: Never  Vaping Use   Vaping status: Never Used  Substance Use Topics   Alcohol use: Yes   Drug use: Not Currently       OPHTHALMIC EXAM:  Base Eye Exam     Visual Acuity (Snellen - Linear)       Right Left   Dist [place] 20/80 -2 20/20   Dist ph [place] 20/60          Tonometry (Tonopen, 7:50 AM)       Right Left   Pressure 15 17         Pupils       Dark Light Shape React APD   Right 3 2 Round Brisk None   Left 3 2 Round Brisk None         Visual Fields (Counting fingers)       Left Right    Full Full         Extraocular Movement       Right Left    Full, Ortho Full, Ortho         Neuro/Psych     Oriented x3: Yes   Mood/Affect: Normal         Dilation     Both eyes: 1.0% Mydriacyl, 2.5% Phenylephrine @ 7:50 AM           Slit Lamp and Fundus Exam     Slit Lamp Exam       Right Left   Lids/Lashes Normal Dermatochalasis - upper lid   Conjunctiva/Sclera White and quiet White and quiet   Cornea 2+ Punctate epithelial erosions, well healed cataract wound, mild endo pigment Trace tear film debris   Anterior Chamber deep and clear deep, clear   Iris Round and dilated, focal atrophy at 1030 Round and  dilated   Lens PC IOL in good position with open PC 2+ Nuclear sclerosis, 2+ Cortical cataract   Anterior Vitreous post vitrectomy, clear mild syneresis         Fundus Exam       Right Left   Disc Compact, mild Pallor, Sharp rim Pink and Sharp   C/D Ratio 0.1 0.1   Macula Flat, blunted foveal reflex, +ERM, cystic changes centrally -- improved, lamellar hole, No heme Flat, Good foveal reflex, No heme or edema   Vessels attenuated, mild tortuosity attenuated,  mild tortuosity, mild AV crossing changes   Periphery Retina attached over buckle, good buckle height, good laser changes over buckle, retinal cyst at 0630; ORIGINALLY: bullous RD from 0600-1230 with retinal tears at 1030, 1100, 1200 and 0700 Attached, pigment lattice from 0500-0600 with retinal holes at 0500 and 0600 with shallow SRF, focal lattice at 0730, pigmented lattice at 0100 and 0130 - no SRF; VR tufts at 1030 and 11; all lesion with good laser changes surrounding, no new RT/RD or lattice           IMAGING AND PROCEDURES  Imaging and Procedures for 01/21/2023  OCT, Retina - OU - Both Eyes       Right Eye Quality was good. Central Foveal Thickness: 443. Progression has improved. Findings include no SRF, abnormal foveal contour, epiretinal membrane, intraretinal fluid, macular pucker, outer retinal atrophy (retina reattached; +ERM with mild pucker, central thickening and lamellar hole, persistent central and inferior cystic changes -- improved, no SRF).   Left Eye Quality was good. Central Foveal Thickness: 308. Progression has been stable. Findings include normal foveal contour, no IRF, no SRF, vitreomacular adhesion .   Notes *Images captured and stored on drive  Diagnosis / Impression:  OD: retina reattached; +ERM with mild pucker, central thickening and lamellar hole, persistent central and inferior cystic changes -- improved, no SRF OS: NFP, no IRF/SRF  Clinical management:  See below  Abbreviations: NFP -  Normal foveal profile. CME - cystoid macular edema. PED - pigment epithelial detachment. IRF - intraretinal fluid. SRF - subretinal fluid. EZ - ellipsoid zone. ERM - epiretinal membrane. ORA - outer retinal atrophy. ORT - outer retinal tubulation. SRHM - subretinal hyper-reflective material. IRHM - intraretinal hyper-reflective material            ASSESSMENT/PLAN:    ICD-10-CM   1. 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 Retinal hole of both eyes  H33.323     4. 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 Pseudophakia  Z96.1     6. Cystoid macular edema of right eye  H35.351     7. Epiretinal membrane (ERM) of right eye  H35.371      Rhegmatogenous retinal detachment, OD - bullous temporal mac off detachment, onset of foveal involvement 6.14.23 by pt history -- maybe earlier - detached temporally from 0600-1230 oclock, fovea off, +corrugations - 4 tears within detachment -- 0730, 1030, 1100, and 1200; +lattice degen within detachment also - s/p SBP + PPV/PFO/EL/FAX/14% C3F8 OD, 06.29.23             - doing well             - retina attached, good buckle height and laser around breaks             - IOP 16 today  - AT's prn - pt currently on Loteprednol TID OD -- continue  - f/u 3-4 months, DFE OU  2,3. Lattice degeneration w/ atrophic holes, left eye - pigmented lattice from 0500-0600 with retinal holes at 0500 and 0600 with shallow SRF; focal lattice at 0730, pigmented lattice from 0100 and 0130 - no SRF - s/p laser retinopexy OS (06.19.23) - s/p touch up laser in OR (06.29.23) -- good laser changes around 1030 and 1100 VR tufts - monitor  4. Mixed Cataract OS - The symptoms  of cataract, surgical options, and treatments and risks were discussed with patient. - discussed diagnosis and progression - not yet visually significant - monitor  5. Pseudophakia OD  - s/p  CE/IOL OD (Dr. Richardson Landry at [facility], 11.14.23)  - IOL in good position, doing well  - monitor  6. CME OD  - s/p STK OD #1 (04.09.24), #2 (06.25.24) -- minimal improvement w/ STKs  - mild persistent [place] OD -- improved today  - OCT shows interval improvement in CME / inferior retinoschisis  - pt currently on Loteprednol TID OD  - STK informed consent obtained and signed, 04.09.24 (OD)  - f/u 3-4 months, sooner prn DFE, OCT  7. ERM w/ retinoschisis  - cystic changes may be progressive schisis  - BCVA 20/60 from 20/70  - discussed ERM treatments  - recommend monitoring  Ophthalmic Meds Ordered this visit:  No orders of the defined types were placed in this encounter.    Return for f/u 3-4 months, RD OD, DFE, OCT.  There are no Patient Instructions on file for this visit.  This document serves as a record of services personally performed by Karie Chimera, MD, PhD. It was created on their behalf by Berlin Hun COT, an ophthalmic technician. The creation of this record is the provider's dictation and/or activities during the visit.    Electronically signed by: Berlin Hun COT TODAY 12.05.24 12:39 AM  This document serves as a record of services personally performed by Karie Chimera, MD, PhD. It was created on their behalf by Glee Arvin. Manson Passey, OA an ophthalmic technician. The creation of this record is the provider's dictation and/or activities during the visit.    Electronically signed by: Glee Arvin. Manson Passey, OA 01/22/23 12:39 AM  Karie Chimera, M.D., Ph.D. Diseases & Surgery of the Retina and Vitreous [facility] 01/21/2023   I have reviewed the above documentation for accuracy and completeness, and I agree with the above. Karie Chimera, M.D., Ph.D. 01/22/23 12:42 AM   Abbreviations: M myopia (nearsighted); A astigmatism; H hyperopia (farsighted); P presbyopia; Mrx spectacle prescription;  CTL contact lenses; OD right eye; OS left eye;  OU both eyes  XT exotropia; ET esotropia; PEK punctate epithelial keratitis; PEE punctate epithelial erosions; DES dry eye syndrome; MGD meibomian gland dysfunction; ATs artificial tears; PFAT's preservative free artificial tears; NSC nuclear sclerotic cataract; PSC posterior subcapsular cataract; ERM epi-retinal membrane; PVD posterior vitreous detachment; RD retinal detachment; DM diabetes mellitus; DR diabetic retinopathy; NPDR non-proliferative diabetic retinopathy; PDR proliferative diabetic retinopathy; CSME clinically significant macular edema; DME diabetic macular edema; dbh dot blot hemorrhages; CWS cotton wool spot; POAG primary open angle glaucoma; C/D cup-to-disc ratio; HVF humphrey visual field; GVF goldmann visual field; OCT optical coherence tomography; IOP intraocular pressure; BRVO Branch retinal vein occlusion; CRVO central retinal vein occlusion; CRAO central retinal artery occlusion; BRAO branch retinal artery occlusion; RT retinal tear; SB scleral buckle; PPV pars plana vitrectomy; VH Vitreous hemorrhage; PRP panretinal laser photocoagulation; IVK intravitreal kenalog; VMT vitreomacular traction; MH Macular hole;  NVD neovascularization of the disc; NVE neovascularization elsewhere; AREDS age related eye disease study; ARMD age related macular degeneration; POAG primary open angle glaucoma; EBMD epithelial/anterior basement membrane dystrophy; ACIOL anterior chamber intraocular lens; IOL intraocular lens; PCIOL posterior chamber intraocular lens; Phaco/IOL phacoemulsification with intraocular lens placement; PRK photorefractive keratectomy; LASIK laser assisted in situ keratomileusis; HTN hypertension; DM diabetes mellitus; COPD chronic obstructive pulmonary disease

## 2023-01-21 ENCOUNTER — Encounter (INDEPENDENT_AMBULATORY_CARE_PROVIDER_SITE_OTHER): Payer: Self-pay | Admitting: Ophthalmology

## 2023-01-21 ENCOUNTER — Ambulatory Visit (INDEPENDENT_AMBULATORY_CARE_PROVIDER_SITE_OTHER): Payer: 59 | Admitting: Ophthalmology

## 2023-01-21 DIAGNOSIS — H25812 Combined forms of age-related cataract, left eye: Secondary | ICD-10-CM

## 2023-01-21 DIAGNOSIS — H35412 Lattice degeneration of retina, left eye: Secondary | ICD-10-CM

## 2023-01-21 DIAGNOSIS — H3321 Serous retinal detachment, right eye: Secondary | ICD-10-CM

## 2023-01-21 DIAGNOSIS — Z961 Presence of intraocular lens: Secondary | ICD-10-CM

## 2023-01-21 DIAGNOSIS — H35371 Puckering of macula, right eye: Secondary | ICD-10-CM | POA: Diagnosis not present

## 2023-01-21 DIAGNOSIS — H35351 Cystoid macular degeneration, right eye: Secondary | ICD-10-CM

## 2023-01-21 DIAGNOSIS — H33323 Round hole, bilateral: Secondary | ICD-10-CM

## 2023-01-22 ENCOUNTER — Encounter (INDEPENDENT_AMBULATORY_CARE_PROVIDER_SITE_OTHER): Payer: Self-pay | Admitting: Ophthalmology

## 2023-05-06 ENCOUNTER — Encounter (INDEPENDENT_AMBULATORY_CARE_PROVIDER_SITE_OTHER): Payer: 59 | Admitting: Ophthalmology

## 2023-05-06 DIAGNOSIS — H25812 Combined forms of age-related cataract, left eye: Secondary | ICD-10-CM

## 2023-05-06 DIAGNOSIS — H35371 Puckering of macula, right eye: Secondary | ICD-10-CM

## 2023-05-06 DIAGNOSIS — H33323 Round hole, bilateral: Secondary | ICD-10-CM

## 2023-05-06 DIAGNOSIS — Z961 Presence of intraocular lens: Secondary | ICD-10-CM

## 2023-05-06 DIAGNOSIS — H35351 Cystoid macular degeneration, right eye: Secondary | ICD-10-CM

## 2023-05-06 DIAGNOSIS — H35412 Lattice degeneration of retina, left eye: Secondary | ICD-10-CM

## 2023-05-06 DIAGNOSIS — H3321 Serous retinal detachment, right eye: Secondary | ICD-10-CM

## 2023-05-09 NOTE — Progress Notes (Signed)
 [facility] - Clinic Note  05/13/2023     CHIEF COMPLAINT Patient presents for Retina Follow Up   HISTORY OF PRESENT ILLNESS: Isabella Morales is a 53 y.o. female who presents to the clinic today for:   HPI     Retina Follow Up   Patient presents with  Retinal Break/Detachment.  In right eye.  Severity is moderate.  Duration of 4 months.  Since onset it is stable.  I, the attending physician,  performed the HPI with the patient and updated documentation appropriately.        Comments   Patient states vision the same OU. Pt has noticed a new floater in the right eye but only in the mornings and it goes away after a few hours. Pt denies any flashes or discomfort Pt is consistent with lotemax tid OD, Restasis bid OD, and discontinued ophthalmic growth factor qid OD about 1 month ago, pt started taking meibo 3 weeks ago qid OD.       Last edited by Rennis Chris, MD on 05/13/2023 12:59 PM.    Pt saw a dry eye specialist at [facility], she is using Restasis, lotemax and Meibo, she feels like the vision is her right eye is about the same, she is no longer using serum tears   Referring physician: No referring provider defined for this encounter.  HISTORICAL INFORMATION:   Selected notes from the MEDICAL RECORD NUMBER Referred by [facility] for mac off RD LEE:  Ocular Hx- PMH-    CURRENT MEDICATIONS: Current Outpatient Medications (Ophthalmic Drugs)  Medication Sig   cycloSPORINE (RESTASIS) 0.05 % ophthalmic emulsion Place 1 drop into the right eye 2 (two) times daily.   loteprednol (LOTEMAX) 0.5 % ophthalmic suspension Place 1 drop into the right eye 3 (three) times daily.   Perfluorohexyloctane (MIEBO) 1.338 GM/ML SOLN Apply 1 drop to eye QID.   bacitracin-polymyxin b (POLYSPORIN) ophthalmic ointment Place into the right eye 4 (four) times daily. Place a 1/2 inch ribbon of ointment into the lower eyelid. (Patient not taking: Reported on 05/13/2023)    brimonidine (ALPHAGAN) 0.2 % ophthalmic solution Place 1 drop into the right eye 2 (two) times daily. (Patient not taking: Reported on 05/13/2023)   Bromfenac Sodium (PROLENSA) 0.07 % SOLN Place 1 drop into the right eye in the morning, at noon, and at bedtime. (Patient not taking: Reported on 05/13/2023)   ketorolac (ACULAR) 0.5 % ophthalmic solution Place 1 drop into the right eye 4 (four) times daily. (Patient not taking: Reported on 05/13/2023)   prednisoLONE acetate (PRED FORTE) 1 % ophthalmic suspension Place 1 drop into the right eye 3 (three) times daily. (Patient not taking: Reported on 05/13/2023)   Varenicline Tartrate (TYRVAYA) 0.03 MG/ACT SOLN Place into the nose. (Patient not taking: Reported on 05/13/2023)   No current facility-administered medications for this visit. (Ophthalmic Drugs)   Current Outpatient Medications (Other)  Medication Sig   CALCIUM PO Take 2 tablets by mouth daily. Gummie (Patient not taking: Reported on 01/28/2022)   No current facility-administered medications for this visit. (Other)   REVIEW OF SYSTEMS: ROS   Positive for: Eyes Negative for: Constitutional, Gastrointestinal, Neurological, Skin, Genitourinary, Musculoskeletal, HENT, Endocrine, Cardiovascular, Respiratory, Psychiatric, Allergic/Imm, Heme/Lymph Last edited by Elicia Lamp, COT on 05/13/2023  7:38 AM.     ALLERGIES No Known Allergies  PAST MEDICAL HISTORY Past Medical History:  Diagnosis Date   Medical history non-contributory    Past Surgical History:  Procedure  Laterality Date   CATARACT EXTRACTION Right    Dr. Richardson Landry @ [facility].   EYE EXAMINATION UNDER ANESTHESIA Left 08/13/2021   Procedure: LEFT EYE EXAM UNDER ANESTHESIA WITH LASER INDIRECT OPHTHALMOSCOPY;  Surgeon: Rennis Chris, MD;  Location: [facility] OR;  Service: Ophthalmology;  Laterality: Left;   GAS INSERTION Right 08/13/2021   Procedure: INSERTION OF GAS;  Surgeon: Rennis Chris, MD;  Location: [facility] OR;  Service: Ophthalmology;   Laterality: Right;   GAS/FLUID EXCHANGE Right 08/13/2021   Procedure: GAS/FLUID EXCHANGE;  Surgeon: Rennis Chris, MD;  Location: [facility] OR;  Service: Ophthalmology;  Laterality: Right;   LASER PHOTO ABLATION Right 08/13/2021   Procedure: LASER PHOTO ABLATION;  Surgeon: Rennis Chris, MD;  Location: [facility] OR;  Service: Ophthalmology;  Laterality: Right;   PERFLUORONE INJECTION Right 08/13/2021   Procedure: PERFLUORONE INJECTION;  Surgeon: Rennis Chris, MD;  Location: [facility] OR;  Service: Ophthalmology;  Laterality: Right;   SCLERAL BUCKLE Right 08/13/2021   Procedure: SCLERAL BUCKLE PROCEDURE IN RIGHT EYE;  Surgeon: Rennis Chris, MD;  Location: [facility] OR;  Service: Ophthalmology;  Laterality: Right;   VITRECTOMY 25 GAUGE WITH SCLERAL BUCKLE Right 08/13/2021   Procedure: VITRECTOMY 25 GAUGE;  Surgeon: Rennis Chris, MD;  Location: [facility] OR;  Service: Ophthalmology;  Laterality: Right;   FAMILY HISTORY Family History  Problem Relation Age of Onset   Diabetes Mother    Heart disease Father    SOCIAL HISTORY Social History   Tobacco Use   Smoking status: Never   Smokeless tobacco: Never  Vaping Use   Vaping status: Never Used  Substance Use Topics   Alcohol use: Yes   Drug use: Not Currently       OPHTHALMIC EXAM:  Base Eye Exam     Visual Acuity (Snellen - Linear)       Right Left   Dist [place] 20/70 -2 20/25 -2   Dist ph [place] 20/70 +2 20/20         Tonometry (Tonopen, 7:49 AM)       Right Left   Pressure 17 19         Pupils       Dark Light Shape React APD   Right 3 2 Round Brisk None   Left 3 2 Round Brisk None         Visual Fields       Left Right    Full Full         Extraocular Movement       Right Left    Full, Ortho Full, Ortho         Neuro/Psych     Oriented x3: Yes   Mood/Affect: Normal         Dilation     Both eyes: 1.0% Mydriacyl, 2.5% Phenylephrine @ 7:49 AM           Slit Lamp and Fundus Exam     Slit Lamp Exam       Right  Left   Lids/Lashes Normal Dermatochalasis - upper lid   Conjunctiva/Sclera White and quiet White and quiet   Cornea 2+ Punctate epithelial erosions, well healed cataract wound, mild endo pigment Trace tear film debris   Anterior Chamber deep and clear deep, clear   Iris Round and dilated, focal atrophy at 1030 Round and dilated   Lens PC IOL in good position with open PC 2+ Nuclear sclerosis, 2+ Cortical cataract   Anterior Vitreous post vitrectomy, clear mild syneresis  Fundus Exam       Right Left   Disc Compact, mild Pallor, Sharp rim Pink and Sharp   C/D Ratio 0.1 0.1   Macula Flat, blunted foveal reflex, +ERM, cystic changes centrally, lamellar hole -- improved, No heme Flat, Good foveal reflex, No heme or edema   Vessels attenuated, mild tortuosity attenuated, mild tortuosity, mild AV crossing changes   Periphery Retina attached over buckle, good buckle height, good laser changes over buckle, retinal cyst at 0630; ORIGINALLY: bullous RD from 9528-4132 with retinal tears at 1030, 1100, 1200 and 0700 Attached, pigment lattice from 0500-0600 with retinal holes at 0500 and 0600 with shallow SRF, focal lattice at 0730, pigmented lattice at 0100 and 0130 - no SRF; VR tufts at 1030 and 11; all lesion with good laser changes surrounding, no new RT/RD or lattice           IMAGING AND PROCEDURES  Imaging and Procedures for 05/13/2023  OCT, Retina - OU - Both Eyes       Right Eye Quality was good. Central Foveal Thickness: 499. Progression has improved. Findings include no SRF, abnormal foveal contour, epiretinal membrane, intraretinal fluid, macular pucker, outer retinal atrophy (Retina stably reattached; +ERM with mild pucker, lamellar hole -- improved, central thickening, and persistent central and inferior cystic changes, no SRF).   Left Eye Quality was good. Central Foveal Thickness: 307. Progression has been stable. Findings include normal foveal contour, no IRF, no SRF,  vitreomacular adhesion .   Notes *Images captured and stored on drive  Diagnosis / Impression:  OD: Retina stably reattached; +ERM with mild pucker, lamellar hole -- improved, central thickening, and persistent central and inferior cystic changes, no SRF OS: NFP, no IRF/SRF  Clinical management:  See below  Abbreviations: NFP - Normal foveal profile. CME - cystoid macular edema. PED - pigment epithelial detachment. IRF - intraretinal fluid. SRF - subretinal fluid. EZ - ellipsoid zone. ERM - epiretinal membrane. ORA - outer retinal atrophy. ORT - outer retinal tubulation. SRHM - subretinal hyper-reflective material. IRHM - intraretinal hyper-reflective material            ASSESSMENT/PLAN:    ICD-10-CM   1. 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 Retinal hole of both eyes  H33.323     4. 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 Pseudophakia  Z96.1     6. Cystoid macular edema of right eye  H35.351     7. Epiretinal membrane (ERM) of right eye  H35.371      1. 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 Lattice degeneration w/ atrophic holes, left eye -  pigmented lattice from 0500-0600 with retinal holes at 0500 and 0600 with shallow SRF; focal lattice at 0730, pigmented lattice from 0100 and 0130 - no SRF - s/p laser retinopexy OS  (06.19.23) - s/p touch up laser in OR (06.29.23) -- good laser changes around 1030 and 1100 VR tufts - monitor  4. Mixed Cataract OS - The symptoms of cataract, surgical options, and treatments and risks were discussed with patient. - discussed diagnosis and progression - monitor  5. Pseudophakia OD  - s/p CE/IOL OD (Dr. Richardson Landry at [facility], 11.14.23)  - IOL in good position, doing well  - monitor  6. 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 ERM w/ retinoschisis  - cystic changes may be progressive schisis  - BCVA OD 20/70 from 20/60  - discussed ERM treatments  - recommend monitoring  Ophthalmic Meds Ordered this visit:  No orders of the defined types were placed in this encounter.    Return in about 6 months (around 11/13/2023) for f/u RD OD, DFE, OCT.  There are no Patient Instructions on file for this visit.  This document serves as a record of services personally performed by Karie Chimera, MD, PhD. It was created on their behalf by Berlin Hun COT, an ophthalmic technician. The creation of this record is the provider's dictation and/or activities during the visit.    Electronically signed by: Berlin Hun COT 03.24.25 1:03 PM  This document serves as a record of services personally performed by Karie Chimera, MD, PhD. It was created on their behalf by Glee Arvin. Manson Passey, OA an ophthalmic technician. The creation of this record is the provider's dictation and/or activities during the visit.    Electronically signed by: Glee Arvin. Manson Passey, OA 05/13/23 1:03 PM  Karie Chimera, M.D., Ph.D. Diseases & Surgery of the Retina and Vitreous [facility] [place] 05/13/2023    I have reviewed the above documentation for accuracy and completeness, and I agree with the above. Karie Chimera, M.D., Ph.D. 05/13/23 1:06 PM   Abbreviations: M myopia (nearsighted); A astigmatism; H hyperopia (farsighted); P presbyopia; Mrx spectacle prescription;  CTL contact lenses; OD right eye; OS left eye; OU both eyes  XT exotropia; ET esotropia; PEK punctate epithelial keratitis; PEE punctate epithelial erosions; DES dry eye syndrome; MGD meibomian gland dysfunction; ATs artificial tears; PFAT's preservative free artificial tears; NSC nuclear sclerotic cataract; PSC posterior subcapsular cataract; ERM epi-retinal membrane; PVD posterior vitreous detachment; RD retinal detachment; DM diabetes mellitus; DR diabetic retinopathy; NPDR non-proliferative diabetic retinopathy; PDR proliferative diabetic retinopathy; CSME clinically significant macular edema; DME diabetic macular edema; dbh dot blot hemorrhages; CWS cotton wool spot; POAG primary open angle glaucoma; C/D cup-to-disc ratio; HVF humphrey visual field; GVF goldmann visual field; OCT optical coherence tomography; IOP intraocular pressure; BRVO Branch retinal vein occlusion; CRVO central retinal vein occlusion; CRAO central retinal artery occlusion; BRAO branch retinal artery occlusion; RT retinal tear; SB scleral buckle; PPV pars plana vitrectomy; VH Vitreous hemorrhage; PRP panretinal laser photocoagulation; IVK intravitreal kenalog; VMT vitreomacular traction; MH Macular hole;  NVD neovascularization of the disc; NVE neovascularization elsewhere; AREDS age related eye disease study; ARMD age related macular  degeneration; POAG primary open angle glaucoma; EBMD epithelial/anterior basement membrane dystrophy; ACIOL anterior chamber intraocular lens; IOL intraocular lens; PCIOL posterior chamber intraocular lens; Phaco/IOL phacoemulsification with intraocular lens placement; PRK photorefractive keratectomy; LASIK laser assisted in situ  keratomileusis; HTN hypertension; DM diabetes mellitus; COPD chronic obstructive pulmonary disease

## 2023-05-13 ENCOUNTER — Ambulatory Visit (INDEPENDENT_AMBULATORY_CARE_PROVIDER_SITE_OTHER): Admitting: Ophthalmology

## 2023-05-13 ENCOUNTER — Encounter (INDEPENDENT_AMBULATORY_CARE_PROVIDER_SITE_OTHER): Payer: Self-pay | Admitting: Ophthalmology

## 2023-05-13 DIAGNOSIS — H3321 Serous retinal detachment, right eye: Secondary | ICD-10-CM | POA: Diagnosis not present

## 2023-05-13 DIAGNOSIS — H35371 Puckering of macula, right eye: Secondary | ICD-10-CM | POA: Diagnosis not present

## 2023-05-13 DIAGNOSIS — H33323 Round hole, bilateral: Secondary | ICD-10-CM

## 2023-05-13 DIAGNOSIS — H35351 Cystoid macular degeneration, right eye: Secondary | ICD-10-CM | POA: Diagnosis not present

## 2023-05-13 DIAGNOSIS — Z961 Presence of intraocular lens: Secondary | ICD-10-CM

## 2023-05-13 DIAGNOSIS — H35412 Lattice degeneration of retina, left eye: Secondary | ICD-10-CM

## 2023-05-13 DIAGNOSIS — H25812 Combined forms of age-related cataract, left eye: Secondary | ICD-10-CM

## 2023-05-30 ENCOUNTER — Other Ambulatory Visit: Payer: Self-pay | Admitting: Obstetrics and Gynecology

## 2023-05-30 DIAGNOSIS — R928 Other abnormal and inconclusive findings on diagnostic imaging of breast: Secondary | ICD-10-CM

## 2023-06-06 ENCOUNTER — Ambulatory Visit
Admission: RE | Admit: 2023-06-06 | Discharge: 2023-06-06 | Disposition: A | Discharge status: Home / Self Care | Source: Ambulatory Visit | Attending: Obstetrics and Gynecology | Admitting: Obstetrics and Gynecology

## 2023-06-06 ENCOUNTER — Ambulatory Visit

## 2023-06-06 DIAGNOSIS — R928 Other abnormal and inconclusive findings on diagnostic imaging of breast: Secondary | ICD-10-CM

## 2023-11-07 NOTE — Progress Notes (Signed)
 [facility] - Clinic Note  11/11/2023     CHIEF COMPLAINT Patient presents for Retina Follow Up   HISTORY OF PRESENT ILLNESS: Isabella Morales is a 53 y.o. female who presents to the clinic today for:   HPI     Retina Follow Up   Patient presents with  Retinal Break/Detachment.  In right eye.  This started 6 months ago.  Duration of 6 months.  Since onset it is stable.  I, the attending physician,  performed the HPI with the patient and updated documentation appropriately.        Comments   6 month retina follow up RD OD pt is reporting vision is about the same she is still having photo phobia she is having floaters but denies any flashes       Last edited by Valdemar Rogue, MD on 11/16/2023  8:42 PM.     Patient states the vision is a little blurry in the right eye.   Referring physician: No referring provider defined for this encounter.  HISTORICAL INFORMATION:   Selected notes from the MEDICAL RECORD NUMBER Referred by [facility] for mac off RD LEE:  Ocular Hx- PMH-    CURRENT MEDICATIONS: Current Outpatient Medications (Ophthalmic Drugs)  Medication Sig   bacitracin -polymyxin b  (POLYSPORIN ) ophthalmic ointment Place into the right eye 4 (four) times daily. Place a 1/2 inch ribbon of ointment into the lower eyelid.   brimonidine  (ALPHAGAN ) 0.2 % ophthalmic solution Place 1 drop into the right eye 2 (two) times daily.   cycloSPORINE (RESTASIS) 0.05 % ophthalmic emulsion Place 1 drop into the right eye 2 (two) times daily.   ketorolac (ACULAR) 0.5 % ophthalmic solution Place 1 drop into the right eye 4 (four) times daily.   loteprednol (LOTEMAX) 0.5 % ophthalmic suspension Place 1 drop into the right eye 3 (three) times daily.   prednisoLONE  acetate (PRED FORTE ) 1 % ophthalmic suspension Place 1 drop into the right eye 3 (three) times daily.   Varenicline Tartrate (TYRVAYA) 0.03 MG/ACT SOLN Place into the nose.   Bromfenac  Sodium (PROLENSA )  0.07 % SOLN Place 1 drop into the right eye in the morning, at noon, and at bedtime.   No current facility-administered medications for this visit. (Ophthalmic Drugs)   Current Outpatient Medications (Other)  Medication Sig   CALCIUM PO Take 2 tablets by mouth daily. Gummie   No current facility-administered medications for this visit. (Other)   REVIEW OF SYSTEMS: ROS   Positive for: Eyes Negative for: Constitutional, Gastrointestinal, Neurological, Skin, Genitourinary, Musculoskeletal, HENT, Endocrine, Cardiovascular, Respiratory, Psychiatric, Allergic/Imm, Heme/Lymph Last edited by Resa Delon ORN, COT on 11/11/2023  7:51 AM.      ALLERGIES No Known Allergies  PAST MEDICAL HISTORY Past Medical History:  Diagnosis Date   Medical history non-contributory    Past Surgical History:  Procedure Laterality Date   CATARACT EXTRACTION Right    Dr. Jefm @ [facility].   EYE EXAMINATION UNDER ANESTHESIA Left 08/13/2021   Procedure: LEFT EYE EXAM UNDER ANESTHESIA WITH LASER INDIRECT OPHTHALMOSCOPY;  Surgeon: Valdemar Rogue, MD;  Location: [facility] OR;  Service: Ophthalmology;  Laterality: Left;   GAS INSERTION Right 08/13/2021   Procedure: INSERTION OF GAS;  Surgeon: Valdemar Rogue, MD;  Location: [facility] OR;  Service: Ophthalmology;  Laterality: Right;   GAS/FLUID EXCHANGE Right 08/13/2021   Procedure: GAS/FLUID EXCHANGE;  Surgeon: Valdemar Rogue, MD;  Location: [facility] OR;  Service: Ophthalmology;  Laterality: Right;   LASER PHOTO ABLATION Right 08/13/2021  Procedure: LASER PHOTO ABLATION;  Surgeon: Valdemar Rogue, MD;  Location: [facility] OR;  Service: Ophthalmology;  Laterality: Right;   PERFLUORONE INJECTION Right 08/13/2021   Procedure: PERFLUORONE INJECTION;  Surgeon: Valdemar Rogue, MD;  Location: [facility] OR;  Service: Ophthalmology;  Laterality: Right;   SCLERAL BUCKLE Right 08/13/2021   Procedure: SCLERAL BUCKLE PROCEDURE IN RIGHT EYE;  Surgeon: Valdemar Rogue, MD;  Location: [facility] OR;  Service: Ophthalmology;   Laterality: Right;   VITRECTOMY 25 GAUGE WITH SCLERAL BUCKLE Right 08/13/2021   Procedure: VITRECTOMY 25 GAUGE;  Surgeon: Valdemar Rogue, MD;  Location: [facility] OR;  Service: Ophthalmology;  Laterality: Right;   FAMILY HISTORY Family History  Problem Relation Age of Onset   Diabetes Mother    Heart disease Father    SOCIAL HISTORY Social History   Tobacco Use   Smoking status: Never   Smokeless tobacco: Never  Vaping Use   Vaping status: Never Used  Substance Use Topics   Alcohol use: Yes   Drug use: Not Currently       OPHTHALMIC EXAM:  Base Eye Exam     Visual Acuity (Snellen - Linear)       Right Left   Dist [place] 20/100 20/25   Dist ph [place] 20/100 +1 NI         Tonometry (Tonopen, 8:00 AM)       Right Left   Pressure 14 15         Pupils       Pupils Dark Light Shape React APD   Right PERRL 3 2 Round Brisk None   Left PERRL 3 2 Round Brisk None         Visual Fields       Left Right    Full Full         Extraocular Movement       Right Left    Full, Ortho Full, Ortho         Neuro/Psych     Oriented x3: Yes   Mood/Affect: Normal         Dilation     Both eyes: 2.5% Phenylephrine  @ 8:00 AM           Slit Lamp and Fundus Exam     Slit Lamp Exam       Right Left   Lids/Lashes Normal Dermatochalasis - upper lid   Conjunctiva/Sclera 1+ Injection White and quiet   Cornea 2+ Punctate epithelial erosions, well healed cataract wound, Debris in tear film Trace tear film debris   Anterior Chamber 0.5+ fine cell/pigment deep, clear   Iris focal atrophy at 1030, Round and moderately dilated Round and dilated   Lens PC IOL in good position with open PC 2+ Nuclear sclerosis, 2+ Cortical cataract   Anterior Vitreous post vitrectomy, clear mild syneresis         Fundus Exam       Right Left   Disc Compact, Pink and sharp Pink and Sharp   C/D Ratio 0.1 0.1   Macula Flat, blunted foveal reflex, +ERM, cystic changes centrally-  increased, lamellar hole -- stably improved, No heme Flat, Good foveal reflex, No heme or edema   Vessels attenuated, mild tortuosity, Copper wiring attenuated, mild tortuosity, mild AV crossing changes   Periphery Retina attached over buckle, good buckle height, good laser changes over buckle, retinal cyst at 0630; ORIGINALLY: bullous RD from [id number] with retinal tears at 1030, 1100, 1200 and 0700 Attached, pigment lattice from 0500-0600 with retinal  holes at 0500 and 0600 with shallow SRF, focal lattice at 0730, pigmented lattice at 0100 and 0130 - no SRF; VR tufts at 1030 and 11; all lesion with good laser changes surrounding, no new RT/RD or lattice           IMAGING AND PROCEDURES  Imaging and Procedures for 11/11/2023  OCT, Retina - OU - Both Eyes       Right Eye Quality was good. Central Foveal Thickness: 572. Progression has worsened. Findings include no SRF, abnormal foveal contour, epiretinal membrane, intraretinal fluid, macular pucker, outer retinal atrophy (Retina stably reattached; +ERM with mild pucker, lamellar hole -- improved, central thickening with mild increase central and inferior cystic changes, no SRF).   Left Eye Quality was good. Central Foveal Thickness: 299. Progression has been stable. Findings include normal foveal contour, no IRF, no SRF, vitreomacular adhesion .   Notes *Images captured and stored on drive  Diagnosis / Impression:  OD: Retina stably reattached; +ERM with mild pucker, lamellar hole -- improved, central thickening with mild increase central and inferior cystic changes, no SRF OS: NFP, no IRF/SRF  Clinical management:  See below  Abbreviations: NFP - Normal foveal profile. CME - cystoid macular edema. PED - pigment epithelial detachment. IRF - intraretinal fluid. SRF - subretinal fluid. EZ - ellipsoid zone. ERM - epiretinal membrane. ORA - outer retinal atrophy. ORT - outer retinal tubulation. SRHM - subretinal hyper-reflective material.  IRHM - intraretinal hyper-reflective material             ASSESSMENT/PLAN:    ICD-10-CM   1. 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 Retinal hole of both eyes  H33.323     4. 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 Pseudophakia  Z96.1     6. Cystoid macular edema of right eye  H35.351     7. Epiretinal membrane (ERM) of right eye  H35.371      1. 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 Lattice degeneration w/ atrophic holes, left eye - pigmented lattice from 0500-0600 with retinal holes at 0500 and 0600 with shallow SRF; focal lattice at 0730, pigmented lattice from 0100 and 0130 - no SRF - s/p laser retinopexy OS (06.19.23) - s/p touch up laser in OR (06.29.23) -- good laser changes around 1030 and 1100 VR tufts - monitor  4. Mixed Cataract OS - The symptoms of cataract, surgical options, and treatments and risks were discussed with patient. - discussed diagnosis and progression - monitor  5. Pseudophakia OD  -  s/p CE/IOL OD (Dr. Jefm at [facility], 11.14.23)  - IOL in good position, doing well  - monitor  6. 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 ERM w/ retinoschisis  - cystic changes may be progressive schisis  - BCVA OD 20/70 from 20/60  - discussed ERM treatments  - recommend monitoring  Ophthalmic Meds Ordered this visit:  Meds ordered this encounter  Medications   Bromfenac  Sodium (PROLENSA ) 0.07 % SOLN    Sig: Place 1 drop into the right eye in the morning, at noon, and at bedtime.    Dispense:  6 mL    Refill:  6     Return in about 6 weeks (around 12/23/2023) for f/u, RD, DFE, OCT.  There are no Patient Instructions on file for this visit.  This document serves as a record of services personally performed by Redell JUDITHANN Hans, MD, PhD. It was created on their behalf by Delon Newness COT, an ophthalmic technician. The creation of this record is the provider's dictation and/or activities during the visit.    Electronically signed by: Delon Newness COT 09.22.2024  8:46 PM  This document serves as a record of services personally performed by Redell JUDITHANN Hans, MD, PhD. It was created on their behalf by Wanda GEANNIE Keens, COT an ophthalmic technician. The creation of this record is the provider's dictation and/or activities during the visit.    Electronically signed by:  Wanda GEANNIE Keens, COT  11/16/23 8:46 PM  Redell JUDITHANN Hans, M.D., Ph.D. Diseases & Surgery of the Retina and Vitreous [facility] 11/11/2023   I have reviewed the above documentation for accuracy and completeness, and I agree with the above. Redell JUDITHANN Hans, M.D., Ph.D. 11/16/23 8:49 PM   Abbreviations: M myopia (nearsighted); A astigmatism; H hyperopia (farsighted); P presbyopia; Mrx spectacle prescription;  CTL contact lenses; OD right eye; OS left eye; OU both eyes  XT exotropia; ET esotropia; PEK  punctate epithelial keratitis; PEE punctate epithelial erosions; DES dry eye syndrome; MGD meibomian gland dysfunction; ATs artificial tears; PFAT's preservative free artificial tears; NSC nuclear sclerotic cataract; PSC posterior subcapsular cataract; ERM epi-retinal membrane; PVD posterior vitreous detachment; RD retinal detachment; DM diabetes mellitus; DR diabetic retinopathy; NPDR non-proliferative diabetic retinopathy; PDR proliferative diabetic retinopathy; CSME clinically significant macular edema; DME diabetic macular edema; dbh dot blot hemorrhages; CWS cotton wool spot; POAG primary open angle glaucoma; C/D cup-to-disc ratio; HVF humphrey visual field; GVF goldmann visual field; OCT optical coherence tomography; IOP intraocular pressure; BRVO Branch retinal vein occlusion; CRVO central retinal vein occlusion; CRAO central retinal artery occlusion; BRAO branch retinal artery occlusion; RT retinal tear; SB scleral buckle; PPV pars plana vitrectomy; VH Vitreous hemorrhage; PRP panretinal laser photocoagulation; IVK intravitreal kenalog ; VMT vitreomacular traction; MH Macular hole;  NVD neovascularization of the disc; NVE neovascularization elsewhere; AREDS age related eye disease study; ARMD age related macular degeneration; POAG primary open angle glaucoma; EBMD epithelial/anterior basement membrane dystrophy; ACIOL anterior chamber intraocular lens; IOL intraocular lens; PCIOL posterior chamber intraocular lens; Phaco/IOL phacoemulsification with intraocular lens placement; PRK photorefractive keratectomy; LASIK laser assisted in situ keratomileusis; HTN hypertension; DM  diabetes mellitus; COPD chronic obstructive pulmonary disease

## 2023-11-11 ENCOUNTER — Ambulatory Visit (INDEPENDENT_AMBULATORY_CARE_PROVIDER_SITE_OTHER): Admitting: Ophthalmology

## 2023-11-11 ENCOUNTER — Encounter (INDEPENDENT_AMBULATORY_CARE_PROVIDER_SITE_OTHER): Payer: Self-pay | Admitting: Ophthalmology

## 2023-11-11 DIAGNOSIS — H25812 Combined forms of age-related cataract, left eye: Secondary | ICD-10-CM

## 2023-11-11 DIAGNOSIS — H33323 Round hole, bilateral: Secondary | ICD-10-CM

## 2023-11-11 DIAGNOSIS — H3321 Serous retinal detachment, right eye: Secondary | ICD-10-CM | POA: Diagnosis not present

## 2023-11-11 DIAGNOSIS — H35351 Cystoid macular degeneration, right eye: Secondary | ICD-10-CM | POA: Diagnosis not present

## 2023-11-11 DIAGNOSIS — H35412 Lattice degeneration of retina, left eye: Secondary | ICD-10-CM

## 2023-11-11 DIAGNOSIS — H35371 Puckering of macula, right eye: Secondary | ICD-10-CM | POA: Diagnosis not present

## 2023-11-11 DIAGNOSIS — Z961 Presence of intraocular lens: Secondary | ICD-10-CM

## 2023-11-11 MED ORDER — BROMFENAC SODIUM 0.07 % OP SOLN: 1.0000 [drp] | Freq: Three times a day (TID) | OPHTHALMIC | 6 refills | Status: AC

## 2023-11-16 ENCOUNTER — Encounter (INDEPENDENT_AMBULATORY_CARE_PROVIDER_SITE_OTHER): Payer: Self-pay | Admitting: Ophthalmology

## 2023-12-21 NOTE — Progress Notes (Signed)
 [facility] - Clinic Note  12/28/2023     CHIEF COMPLAINT Patient presents for Retina Follow Up   HISTORY OF PRESENT ILLNESS: Isabella Morales is a 53 y.o. female who presents to the clinic today for:   HPI     Retina Follow Up   Patient presents with  Retinal Break/Detachment.  In right eye.  This started 6 months ago.  Duration of 6 months.  Since onset it is stable.  I, the attending physician,  performed the HPI with the patient and updated documentation appropriately.        Comments   Pt states vision has been about the same. Pt denies pain. Pt has been consistent with Loteprednol TID OD and Prolensa  TID OD.       Last edited by Valdemar Rogue, MD on 01/02/2024 12:53 AM.     Pt states she is still very light sensitive and has glare from the light. She states these issues are nothing new.   Referring physician: No referring provider defined for this encounter.  HISTORICAL INFORMATION:   Selected notes from the MEDICAL RECORD NUMBER Referred by [facility] for mac off RD LEE:  Ocular Hx- PMH-    CURRENT MEDICATIONS: Current Outpatient Medications (Ophthalmic Drugs)  Medication Sig   bacitracin -polymyxin b  (POLYSPORIN ) ophthalmic ointment Place into the right eye 4 (four) times daily. Place a 1/2 inch ribbon of ointment into the lower eyelid.   brimonidine  (ALPHAGAN ) 0.2 % ophthalmic solution Place 1 drop into the right eye 2 (two) times daily.   Bromfenac  Sodium (PROLENSA ) 0.07 % SOLN Place 1 drop into the right eye in the morning, at noon, and at bedtime.   cycloSPORINE (RESTASIS) 0.05 % ophthalmic emulsion Place 1 drop into the right eye 2 (two) times daily.   ketorolac (ACULAR) 0.5 % ophthalmic solution Place 1 drop into the right eye 4 (four) times daily.   loteprednol (LOTEMAX) 0.5 % ophthalmic suspension Place 1 drop into the right eye 3 (three) times daily.   prednisoLONE  acetate (PRED FORTE ) 1 % ophthalmic suspension Place 1 drop  into the right eye 3 (three) times daily.   Varenicline Tartrate (TYRVAYA) 0.03 MG/ACT SOLN Place into the nose.   No current facility-administered medications for this visit. (Ophthalmic Drugs)   Current Outpatient Medications (Other)  Medication Sig   CALCIUM PO Take 2 tablets by mouth daily. Gummie   No current facility-administered medications for this visit. (Other)   REVIEW OF SYSTEMS: ROS   Positive for: Eyes Negative for: Constitutional, Gastrointestinal, Neurological, Skin, Genitourinary, Musculoskeletal, HENT, Endocrine, Cardiovascular, Respiratory, Psychiatric, Allergic/Imm, Heme/Lymph Last edited by Elnor Avelina RAMAN, COT on 12/28/2023  7:57 AM.       ALLERGIES No Known Allergies  PAST MEDICAL HISTORY Past Medical History:  Diagnosis Date   Medical history non-contributory    Past Surgical History:  Procedure Laterality Date   CATARACT EXTRACTION Right    Dr. Jefm @ [facility].   EYE EXAMINATION UNDER ANESTHESIA Left 08/13/2021   Procedure: LEFT EYE EXAM UNDER ANESTHESIA WITH LASER INDIRECT OPHTHALMOSCOPY;  Surgeon: Valdemar Rogue, MD;  Location: [facility] OR;  Service: Ophthalmology;  Laterality: Left;   GAS INSERTION Right 08/13/2021   Procedure: INSERTION OF GAS;  Surgeon: Valdemar Rogue, MD;  Location[facility] OR;  Service: Ophthalmology;  Laterality: Right;   GAS/FLUID EXCHANGE Right 08/13/2021   Procedure: GAS/FLUID EXCHANGE;  Surgeon: Valdemar Rogue, MD;  Location: [facility] OR;  Service: Ophthalmology;  Laterality: Right;   LASER PHOTO  ABLATION Right 08/13/2021   Procedure: LASER PHOTO ABLATION;  Surgeon: Valdemar Rogue, MD;  Location: [facility] OR;  Service: Ophthalmology;  Laterality: Right;   PERFLUORONE INJECTION Right 08/13/2021   Procedure: PERFLUORONE INJECTION;  Surgeon: Valdemar Rogue, MD;  Location: [facility] OR;  Service: Ophthalmology;  Laterality: Right;   SCLERAL BUCKLE Right 08/13/2021   Procedure: SCLERAL BUCKLE PROCEDURE IN RIGHT EYE;  Surgeon: Valdemar Rogue, MD;  Location: [facility]  OR;  Service: Ophthalmology;  Laterality: Right;   VITRECTOMY 25 GAUGE WITH SCLERAL BUCKLE Right 08/13/2021   Procedure: VITRECTOMY 25 GAUGE;  Surgeon: Valdemar Rogue, MD;  Location: [facility] OR;  Service: Ophthalmology;  Laterality: Right;   FAMILY HISTORY Family History  Problem Relation Age of Onset   Diabetes Mother    Heart disease Father    SOCIAL HISTORY Social History   Tobacco Use   Smoking status: Never   Smokeless tobacco: Never  Vaping Use   Vaping status: Never Used  Substance Use Topics   Alcohol use: Yes   Drug use: Not Currently       OPHTHALMIC EXAM:  Base Eye Exam     Visual Acuity (Snellen - Linear)       Right Left   Dist [place] 20/80 -2 20/20 -2   Dist ph [place] NI          Tonometry (Tonopen, 7:54 AM)       Right Left   Pressure 17 22         Pupils       Pupils Dark Light Shape React APD   Right PERRL 3 2 Round Brisk None   Left PERRL 3 2 Round Brisk None         Visual Fields       Left Right    Full Full         Extraocular Movement       Right Left    Full, Ortho Full, Ortho         Neuro/Psych     Oriented x3: Yes   Mood/Affect: Normal         Dilation     Both eyes: 1.0% Mydriacyl , 2.5% Phenylephrine  @ 7:54 AM           Slit Lamp and Fundus Exam     Slit Lamp Exam       Right Left   Lids/Lashes Normal Dermatochalasis - upper lid   Conjunctiva/Sclera 1+ Injection White and quiet   Cornea 2+ Punctate epithelial erosions, well healed cataract wound, Debris in tear film Trace tear film debris   Anterior Chamber 0.5+ fine cell/pigment deep, clear   Iris focal atrophy at 1030, Round and moderately dilated Round and dilated   Lens PC IOL in good position with open PC 2+ Nuclear sclerosis, 2+ Cortical cataract   Anterior Vitreous post vitrectomy, clear mild syneresis         Fundus Exam       Right Left   Disc Compact, Pink and sharp Pink and Sharp   C/D Ratio 0.1 0.1   Macula Flat, blunted foveal reflex,  +ERM, cystic changes centrally- slightly improved, lamellar hole -- stably improved, No heme Flat, Good foveal reflex, No heme or edema   Vessels attenuated, mild tortuosity, Copper wiring attenuated, mild tortuosity, mild AV crossing changes   Periphery Retina attached over buckle, good buckle height, good laser changes over buckle, retinal cyst at 0630; no new RT/RD ORIGINALLY: bullous RD from [id number] with retinal tears at  1030, 1100, 1200 and 0700 Attached, pigment lattice from 0500-0600 with retinal holes at 0500 and 0600 with shallow SRF, focal lattice at 0730, pigmented lattice at 0100 and 0130 - no SRF; VR tufts at 1030 and 11; all lesion with good laser changes surrounding, no new RT/RD or lattice           IMAGING AND PROCEDURES  Imaging and Procedures for 12/28/2023  OCT, Retina - OU - Both Eyes       Right Eye Quality was good. Central Foveal Thickness: 540. Progression has improved. Findings include no SRF, abnormal foveal contour, epiretinal membrane, intraretinal fluid, macular pucker, outer retinal atrophy (Retina stably reattached; +ERM with mild pucker, lamellar hole -- improved, central thickening with mild central and inferior cystic changes slightly improved, no SRF).   Left Eye Quality was good. Central Foveal Thickness: 308. Progression has been stable. Findings include normal foveal contour, no IRF, no SRF, vitreomacular adhesion .   Notes *Images captured and stored on drive  Diagnosis / Impression:  OD: Retina stably reattached; +ERM with mild pucker, lamellar hole -- improved, central thickening with mild central and inferior cystic changes slightly improved, no SRF OS: NFP, no IRF/SRF  Clinical management:  See below  Abbreviations: NFP - Normal foveal profile. CME - cystoid macular edema. PED - pigment epithelial detachment. IRF - intraretinal fluid. SRF - subretinal fluid. EZ - ellipsoid zone. ERM - epiretinal membrane. ORA - outer retinal atrophy. ORT -  outer retinal tubulation. SRHM - subretinal hyper-reflective material. IRHM - intraretinal hyper-reflective material            ASSESSMENT/PLAN:    ICD-10-CM   1. 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 Retinal hole of both eyes  H33.323     4. 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 Pseudophakia  Z96.1     6. Cystoid macular edema of right eye  H35.351 OCT, Retina - OU - Both Eyes    7. Epiretinal membrane (ERM) of right eye  H35.371      1. 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 Lattice degeneration w/ atrophic holes, left eye - pigmented lattice from 0500-0600 with retinal holes at 0500 and 0600 with shallow SRF; focal lattice at 0730, pigmented lattice from 0100 and 0130 - no SRF - s/p laser retinopexy OS (06.19.23) - s/p touch up laser in OR (06.29.23) -- good laser changes around 1030 and 1100 VR tufts - monitor  4. Mixed Cataract OS - The symptoms of cataract, surgical options, and treatments and risks were discussed with patient. - discussed  diagnosis and progression - monitor  5. Pseudophakia OD  - s/p CE/IOL OD (Dr. 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 ERM w/ retinoschisis  - cystic changes may be progressive schisis  - BCVA OD 20/80 from 20/70   - discussed ERM treatments  - recommend monitoring  Ophthalmic Meds Ordered this visit:  No orders of the defined types were placed in this encounter.    Return in about 8 weeks (around 02/22/2024) for f/u, RD, DFE, OCT.  There are no Patient Instructions on file for this visit.  This document serves as a record of services personally performed by Redell JUDITHANN Hans, MD, PhD. It was created on their behalf by Almetta Pesa, an ophthalmic technician. The creation of this record is the provider's dictation and/or activities during the visit.    Electronically signed by: Almetta Pesa, OA, 01/02/24  1:10 AM  This document serves as a record of services personally performed by Redell JUDITHANN Hans, MD, PhD. It was created on their behalf by Wanda GEANNIE Keens, COT an ophthalmic technician. The creation of this record is the provider's dictation and/or activities during the visit.    Electronically signed by:  Wanda GEANNIE Keens, COT  01/02/24 1:10 AM  Redell JUDITHANN Hans, M.D., Ph.D. Diseases & Surgery of the Retina and Vitreous [facility]  I have reviewed the above documentation for accuracy and completeness, and I agree with the above. Redell JUDITHANN Hans, M.D., Ph.D. 01/02/24 1:18 AM   Abbreviations: M myopia (nearsighted); A astigmatism; H hyperopia (farsighted); P presbyopia; Mrx spectacle prescription;  CTL contact lenses; OD right eye; OS left eye; OU both eyes  XT exotropia; ET esotropia; PEK punctate epithelial keratitis; PEE punctate epithelial erosions; DES dry eye syndrome; MGD meibomian  gland dysfunction; ATs artificial tears; PFAT's preservative free artificial tears; NSC nuclear sclerotic cataract; PSC posterior subcapsular cataract; ERM epi-retinal membrane; PVD posterior vitreous detachment; RD retinal detachment; DM diabetes mellitus; DR diabetic retinopathy; NPDR non-proliferative diabetic retinopathy; PDR proliferative diabetic retinopathy; CSME clinically significant macular edema; DME diabetic macular edema; dbh dot blot hemorrhages; CWS cotton wool spot; POAG primary open angle glaucoma; C/D cup-to-disc ratio; HVF humphrey visual field; GVF goldmann visual field; OCT optical coherence tomography; IOP intraocular pressure; BRVO Branch retinal vein occlusion; CRVO central retinal vein occlusion; CRAO central retinal artery occlusion; BRAO branch retinal artery occlusion; RT retinal tear; SB scleral buckle; PPV pars plana vitrectomy; VH Vitreous hemorrhage; PRP panretinal laser photocoagulation; IVK intravitreal kenalog ; VMT vitreomacular traction; MH Macular hole;  NVD neovascularization of the disc; NVE neovascularization elsewhere; AREDS age related eye disease study; ARMD age related macular degeneration; POAG primary open angle glaucoma; EBMD epithelial/anterior basement membrane dystrophy; ACIOL anterior chamber intraocular lens; IOL intraocular lens; PCIOL posterior chamber intraocular lens; Phaco/IOL phacoemulsification with intraocular lens placement; PRK photorefractive keratectomy; LASIK laser assisted in situ keratomileusis; HTN hypertension; DM diabetes mellitus; COPD chronic obstructive pulmonary disease

## 2023-12-23 ENCOUNTER — Encounter (INDEPENDENT_AMBULATORY_CARE_PROVIDER_SITE_OTHER): Admitting: Ophthalmology

## 2023-12-28 ENCOUNTER — Encounter (INDEPENDENT_AMBULATORY_CARE_PROVIDER_SITE_OTHER): Payer: Self-pay | Admitting: Ophthalmology

## 2023-12-28 ENCOUNTER — Ambulatory Visit (INDEPENDENT_AMBULATORY_CARE_PROVIDER_SITE_OTHER): Admitting: Ophthalmology

## 2023-12-28 DIAGNOSIS — Z961 Presence of intraocular lens: Secondary | ICD-10-CM

## 2023-12-28 DIAGNOSIS — H3321 Serous retinal detachment, right eye: Secondary | ICD-10-CM | POA: Diagnosis not present

## 2023-12-28 DIAGNOSIS — H33323 Round hole, bilateral: Secondary | ICD-10-CM

## 2023-12-28 DIAGNOSIS — H35351 Cystoid macular degeneration, right eye: Secondary | ICD-10-CM

## 2023-12-28 DIAGNOSIS — H25812 Combined forms of age-related cataract, left eye: Secondary | ICD-10-CM | POA: Diagnosis not present

## 2023-12-28 DIAGNOSIS — H35412 Lattice degeneration of retina, left eye: Secondary | ICD-10-CM

## 2023-12-28 DIAGNOSIS — H35371 Puckering of macula, right eye: Secondary | ICD-10-CM | POA: Diagnosis not present

## 2024-01-02 ENCOUNTER — Encounter (INDEPENDENT_AMBULATORY_CARE_PROVIDER_SITE_OTHER): Payer: Self-pay | Admitting: Ophthalmology

## 2024-01-10 ENCOUNTER — Other Ambulatory Visit (INDEPENDENT_AMBULATORY_CARE_PROVIDER_SITE_OTHER): Payer: Self-pay

## 2024-01-10 MED ORDER — LOTEPREDNOL ETABONATE 0.5 % OP SUSP: 1.0000 [drp] | Freq: Four times a day (QID) | OPHTHALMIC | 6 refills | Status: AC

## 2024-02-17 NOTE — Progress Notes (Signed)
 " [facility] - Clinic Note  02/22/2024     CHIEF COMPLAINT Patient presents for Retina Follow Up   HISTORY OF PRESENT ILLNESS: Isabella Morales is a 54 y.o. female who presents to the clinic today for:   HPI     Retina Follow Up   Patient presents with  Retinal Break/Detachment.  In right eye.  This started 8 months ago.  Duration of 8 weeks.  Since onset it is stable.  I, the attending physician,  performed the HPI with the patient and updated documentation appropriately.        Comments   Pt states vision has been about the same. Pt denies pain. Pt has been consistent with Loteprednol  QID OD and Prolensa  QID OD.       Last edited by Valdemar Rogue, MD on 02/23/2024  9:30 PM.     Pt states the vision is the same.   Referring physician: No referring provider defined for this encounter.  HISTORICAL INFORMATION:   Selected notes from the MEDICAL RECORD NUMBER Referred by [facility] for mac off RD LEE:  Ocular Hx- PMH-    CURRENT MEDICATIONS: Current Outpatient Medications (Ophthalmic Drugs)  Medication Sig   bacitracin -polymyxin b  (POLYSPORIN ) ophthalmic ointment Place into the right eye 4 (four) times daily. Place a 1/2 inch ribbon of ointment into the lower eyelid.   brimonidine  (ALPHAGAN ) 0.2 % ophthalmic solution Place 1 drop into the right eye 2 (two) times daily.   Bromfenac  Sodium (PROLENSA ) 0.07 % SOLN Place 1 drop into the right eye in the morning, at noon, and at bedtime.   cycloSPORINE (RESTASIS) 0.05 % ophthalmic emulsion Place 1 drop into the right eye 2 (two) times daily.   ketorolac (ACULAR) 0.5 % ophthalmic solution Place 1 drop into the right eye 4 (four) times daily.   loteprednol  (LOTEMAX ) 0.5 % ophthalmic suspension Place 1 drop into the right eye 4 (four) times daily.   Varenicline Tartrate (TYRVAYA) 0.03 MG/ACT SOLN Place into the nose.   prednisoLONE  acetate (PRED FORTE ) 1 % ophthalmic suspension Place 1 drop into the right  eye 4 (four) times daily.   No current facility-administered medications for this visit. (Ophthalmic Drugs)   Current Outpatient Medications (Other)  Medication Sig   CALCIUM PO Take 2 tablets by mouth daily. Gummie   No current facility-administered medications for this visit. (Other)   REVIEW OF SYSTEMS: ROS   Positive for: Eyes Negative for: Constitutional, Gastrointestinal, Neurological, Skin, Genitourinary, Musculoskeletal, HENT, Endocrine, Cardiovascular, Respiratory, Psychiatric, Allergic/Imm, Heme/Lymph Last edited by Elnor Avelina RAMAN, COT on 02/22/2024  8:00 AM.     ALLERGIES No Known Allergies  PAST MEDICAL HISTORY Past Medical History:  Diagnosis Date   Medical history non-contributory    Past Surgical History:  Procedure Laterality Date   CATARACT EXTRACTION Right    Dr. Jefm @ [facility].   EYE EXAMINATION UNDER ANESTHESIA Left 08/13/2021   Procedure: LEFT EYE EXAM UNDER ANESTHESIA WITH LASER INDIRECT OPHTHALMOSCOPY;  Surgeon: Valdemar Rogue, MD;  Location: [facility] OR;  Service: Ophthalmology;  Laterality: Left;   GAS INSERTION Right 08/13/2021   Procedure: INSERTION OF GAS;  Surgeon: Valdemar Rogue, MD;  Location: [facility] OR;  Service: Ophthalmology;  Laterality: Right;   GAS/FLUID EXCHANGE Right 08/13/2021   Procedure: GAS/FLUID EXCHANGE;  Surgeon: Valdemar Rogue, MD;  Location: [facility] OR;  Service: Ophthalmology;  Laterality: Right;   LASER PHOTO ABLATION Right 08/13/2021   Procedure: LASER PHOTO ABLATION;  Surgeon: Valdemar Rogue, MD;  Location: [facility] OR;  Service: Ophthalmology;  Laterality: Right;   PERFLUORONE INJECTION Right 08/13/2021   Procedure: PERFLUORONE INJECTION;  Surgeon: Valdemar Rogue, MD;  Location: [facility] OR;  Service: Ophthalmology;  Laterality: Right;   SCLERAL BUCKLE Right 08/13/2021   Procedure: SCLERAL BUCKLE PROCEDURE IN RIGHT EYE;  Surgeon: Valdemar Rogue, MD;  Location: [facility] OR;  Service: Ophthalmology;  Laterality: Right;   VITRECTOMY 25 GAUGE WITH SCLERAL BUCKLE  Right 08/13/2021   Procedure: VITRECTOMY 25 GAUGE;  Surgeon: Valdemar Rogue, MD;  Location: [facility] OR;  Service: Ophthalmology;  Laterality: Right;   FAMILY HISTORY Family History  Problem Relation Age of Onset   Diabetes Mother    Heart disease Father    SOCIAL HISTORY Social History   Tobacco Use   Smoking status: Never   Smokeless tobacco: Never  Vaping Use   Vaping status: Never Used  Substance Use Topics   Alcohol use: Yes   Drug use: Not Currently       OPHTHALMIC EXAM:  Base Eye Exam     Visual Acuity (Snellen - Linear)       Right Left   Dist [place] 20/100 -2 20/25   Dist ph [place] 20/100 +1 20/20         Tonometry (Tonopen, 7:57 AM)       Right Left   Pressure 20 25         Pupils       Pupils Dark Light Shape React APD   Right PERRL 3 2 Round Brisk None   Left PERRL 3 2 Round Brisk None         Visual Fields       Left Right    Full Full         Extraocular Movement       Right Left    Full, Ortho Full, Ortho         Neuro/Psych     Oriented x3: Yes   Mood/Affect: Normal         Dilation     Both eyes: 1.0% Mydriacyl  @ 7:58 AM           Slit Lamp and Fundus Exam     Slit Lamp Exam       Right Left   Lids/Lashes Normal Dermatochalasis - upper lid   Conjunctiva/Sclera White and quiet White and quiet   Cornea 2+ Punctate epithelial erosions, well healed cataract wound, Debris in tear film Trace tear film debris   Anterior Chamber 0.5+ fine cell/pigment deep, clear   Iris focal atrophy at 1030, Round and moderately dilated Round and dilated   Lens PC IOL in good position with open PC 2+ Nuclear sclerosis, 2+ Cortical cataract   Anterior Vitreous post vitrectomy, clear mild syneresis         Fundus Exam       Right Left   Disc Compact, Pink and sharp Pink and Sharp   C/D Ratio 0.1 0.1   Macula Flat, blunted foveal reflex, +ERM, cystic changes centrally, No heme Flat, Good foveal reflex, No heme or edema   Vessels  attenuated, mild tortuosity, Copper wiring attenuated, mild tortuosity, mild AV crossing changes   Periphery Retina attached over buckle, good buckle height, good laser changes over buckle, retinal cyst at 0630; no new RT/RD ORIGINALLY: bullous RD from 9399-8769 with retinal tears at 1030, 1100, 1200 and 0700 Attached, pigment lattice from 0500-0600 with retinal holes at 0500 and 0600 with shallow SRF, focal lattice at  0730, pigmented lattice at 0100 and 0130 - no SRF; VR tufts at 1030 and 11; all lesion with good laser changes surrounding, no new RT/RD or lattice           IMAGING AND PROCEDURES  Imaging and Procedures for 02/22/2024  OCT, Retina - OU - Both Eyes       Right Eye Quality was good. Central Foveal Thickness: 613. Progression has worsened. Findings include no SRF, abnormal foveal contour, epiretinal membrane, intraretinal fluid, macular pucker, outer retinal atrophy (Retina stably reattached; +ERM with mild pucker, central thickening with mild central and inferior cystic changes slightly increased, no SRF).   Left Eye Quality was good. Central Foveal Thickness: 310. Progression has been stable. Findings include normal foveal contour, no IRF, no SRF, vitreomacular adhesion .   Notes *Images captured and stored on drive  Diagnosis / Impression:  OD: Retina stably reattached; +ERM with mild pucker, central thickening with mild central and inferior cystic changes slightly increased, no SRF OS: NFP, no IRF/SRF  Clinical management:  See below  Abbreviations: NFP - Normal foveal profile. CME - cystoid macular edema. PED - pigment epithelial detachment. IRF - intraretinal fluid. SRF - subretinal fluid. EZ - ellipsoid zone. ERM - epiretinal membrane. ORA - outer retinal atrophy. ORT - outer retinal tubulation. SRHM - subretinal hyper-reflective material. IRHM - intraretinal hyper-reflective material             ASSESSMENT/PLAN:    ICD-10-CM   1. 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 Retinal hole of both eyes  H33.323     4. 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 Pseudophakia  Z96.1     6. Cystoid macular edema of right eye  H35.351     7. Epiretinal membrane (ERM) of right eye  H35.371      1. Rhegmatogenous retinal detachment, OD - bullous temporal mac off detachment, onset of foveal involvement 6.14.23 by pt history -- maybe earlier - detached temporally from 0600-1230 oclock, fovea off, +corrugations - 4 tears within detachment -- 0730, 1030, 1100, and 1200; +lattice degen within detachment also - s/p SBP + PPV/PFO/EL/FAX/14% C3F8 OD, 06.29.23             - doing well             - retina attached, good buckle height and laser around breaks             - IOP 20 today  - AT's prn - switch to Pred OD QID for CME (see below) - new rx sent to pharmacy - cont Prolensa  OD QID   - f/u 6 weeks, DFE OU, OCT  2,3. Lattice degeneration w/ atrophic holes, left eye - pigmented lattice from 0500-0600 with retinal holes at 0500 and 0600 with shallow SRF; focal lattice at 0730, pigmented lattice from 0100 and 0130 - no SRF - s/p laser retinopexy OS (06.19.23) - s/p touch up laser in OR (06.29.23) -- good laser changes around 1030 and 1100 VR tufts - monitor  4. Mixed Cataract OS - The symptoms of cataract, surgical options, and treatments and risks were discussed with patient. - discussed diagnosis and progression - monitor  5. Pseudophakia OD  - s/p CE/IOL OD (Dr. Jefm at [facility], 11.14.23)  - IOL in good position, doing well  -  monitor  6. CME OD - s/p STK OD #1 (04.09.24), #2 (06.25.24) -- minimal improvement w/ STKs  - mild persistent [place]ass OD -- increased today  - BCVA 20/ - OCT shows Retina stably reattached; +ERM with mild pucker, central thickening with mild central and inferior cystic changes slightly increased, no SRF - pt  currently on Loteprednol  QID -- recommend switching to Prednisolone  QID OD - cont Prolensa  OD QID   - f/u in 6 wks  7. ERM w/ retinoschisis  - cystic changes may be progressive schisis  - BCVA OD 20/100 from 20/80   - discussed ERM treatments  - recommend monitoring  Ophthalmic Meds Ordered this visit:  Meds ordered this encounter  Medications   prednisoLONE  acetate (PRED FORTE ) 1 % ophthalmic suspension    Sig: Place 1 drop into the right eye 4 (four) times daily.    Dispense:  15 mL    Refill:  3     Return in about 6 weeks (around 04/04/2024) for f/u, RD, DFE, OCT.  There are no Patient Instructions on file for this visit.  This document serves as a record of services personally performed by Redell JUDITHANN Hans, MD, PhD. It was created on their behalf by Almetta Pesa, an ophthalmic technician. The creation of this record is the provider's dictation and/or activities during the visit.    Electronically signed by: Almetta Pesa, OA, 02/23/2024  9:30 PM  This document serves as a record of services personally performed by Redell JUDITHANN Hans, MD, PhD. It was created on their behalf by Wanda GEANNIE Keens, COT an ophthalmic technician. The creation of this record is the provider's dictation and/or activities during the visit.    Electronically signed by:  Wanda GEANNIE Keens, COT  02/23/2024 9:30 PM  Redell JUDITHANN Hans, M.D., Ph.D. Diseases & Surgery of the Retina and Vitreous [facility]  I have reviewed the above documentation for accuracy and completeness, and I agree with the above. Redell JUDITHANN Hans, M.D., Ph.D. 02/23/2024 9:32 PM   Abbreviations: M myopia (nearsighted); A astigmatism; H hyperopia (farsighted); P presbyopia; Mrx spectacle prescription;  CTL contact lenses; OD right eye; OS left eye; OU both eyes  XT exotropia; ET esotropia; PEK punctate epithelial keratitis; PEE punctate epithelial erosions; DES dry eye syndrome; MGD meibomian gland dysfunction;  ATs artificial tears; PFAT's preservative free artificial tears; NSC nuclear sclerotic cataract; PSC posterior subcapsular cataract; ERM epi-retinal membrane; PVD posterior vitreous detachment; RD retinal detachment; DM diabetes mellitus; DR diabetic retinopathy; NPDR non-proliferative diabetic retinopathy; PDR proliferative diabetic retinopathy; CSME clinically significant macular edema; DME diabetic macular edema; dbh dot blot hemorrhages; CWS cotton wool spot; POAG primary open angle glaucoma; C/D cup-to-disc ratio; HVF humphrey visual field; GVF goldmann visual field; OCT optical coherence tomography; IOP intraocular pressure; BRVO Branch retinal vein occlusion; CRVO central retinal vein occlusion; CRAO central retinal artery occlusion; BRAO branch retinal artery occlusion; RT retinal tear; SB scleral buckle; PPV pars plana vitrectomy; VH Vitreous hemorrhage; PRP panretinal laser photocoagulation; IVK intravitreal kenalog ; VMT vitreomacular traction; MH Macular hole;  NVD neovascularization of the disc; NVE neovascularization elsewhere; AREDS age related eye disease study; ARMD age related macular degeneration; POAG primary open angle glaucoma; EBMD epithelial/anterior basement membrane dystrophy; ACIOL anterior chamber intraocular lens; IOL intraocular lens; PCIOL posterior chamber intraocular lens; Phaco/IOL phacoemulsification with intraocular lens placement; PRK photorefractive keratectomy; LASIK laser assisted in situ keratomileusis; HTN hypertension; DM diabetes mellitus; COPD chronic obstructive pulmonary disease "

## 2024-02-22 ENCOUNTER — Ambulatory Visit (INDEPENDENT_AMBULATORY_CARE_PROVIDER_SITE_OTHER): Admitting: Ophthalmology

## 2024-02-22 ENCOUNTER — Encounter (INDEPENDENT_AMBULATORY_CARE_PROVIDER_SITE_OTHER): Payer: Self-pay | Admitting: Ophthalmology

## 2024-02-22 DIAGNOSIS — H33323 Round hole, bilateral: Secondary | ICD-10-CM

## 2024-02-22 DIAGNOSIS — H35371 Puckering of macula, right eye: Secondary | ICD-10-CM

## 2024-02-22 DIAGNOSIS — H25812 Combined forms of age-related cataract, left eye: Secondary | ICD-10-CM

## 2024-02-22 DIAGNOSIS — H35351 Cystoid macular degeneration, right eye: Secondary | ICD-10-CM

## 2024-02-22 DIAGNOSIS — Z961 Presence of intraocular lens: Secondary | ICD-10-CM

## 2024-02-22 DIAGNOSIS — H35412 Lattice degeneration of retina, left eye: Secondary | ICD-10-CM

## 2024-02-22 DIAGNOSIS — H3321 Serous retinal detachment, right eye: Secondary | ICD-10-CM | POA: Diagnosis not present

## 2024-02-22 MED ORDER — PREDNISOLONE ACETATE 1 % OP SUSP: 1.0000 [drp] | Freq: Four times a day (QID) | OPHTHALMIC | 3 refills | Status: AC

## 2024-02-23 ENCOUNTER — Encounter (INDEPENDENT_AMBULATORY_CARE_PROVIDER_SITE_OTHER): Payer: Self-pay | Admitting: Ophthalmology

## 2024-04-04 ENCOUNTER — Encounter (INDEPENDENT_AMBULATORY_CARE_PROVIDER_SITE_OTHER): Admitting: Ophthalmology
